# Patient Record
Sex: Male | Born: 1945 | Race: White | Hispanic: No | Marital: Married | Smoking: Former smoker
Health system: Southern US, Community
[De-identification: ages and names within clinical notes are randomized; demographics above are authoritative.]

## PROBLEM LIST (undated history)

## (undated) DIAGNOSIS — R7301 Impaired fasting glucose: Secondary | ICD-10-CM

## (undated) DIAGNOSIS — F419 Anxiety disorder, unspecified: Secondary | ICD-10-CM

## (undated) DIAGNOSIS — K219 Gastro-esophageal reflux disease without esophagitis: Secondary | ICD-10-CM

## (undated) DIAGNOSIS — F32A Depression, unspecified: Secondary | ICD-10-CM

## (undated) HISTORY — DX: Gastro-esophageal reflux disease without esophagitis: K21.9

## (undated) HISTORY — PX: HERNIA REPAIR: SHX51

## (undated) HISTORY — DX: Impaired fasting glucose: R73.01

## (undated) HISTORY — PX: OTHER SURGICAL HISTORY: SHX169

## (undated) HISTORY — PX: CERVICAL DISC SURGERY: SHX588

---

## 1998-03-23 ENCOUNTER — Inpatient Hospital Stay (HOSPITAL_COMMUNITY): Admission: RE | Admit: 1998-03-23 | Discharge: 1998-03-24 | Payer: Self-pay | Admitting: Neurosurgery

## 2004-01-05 ENCOUNTER — Emergency Department (HOSPITAL_COMMUNITY): Admission: EM | Admit: 2004-01-05 | Discharge: 2004-01-05 | Payer: Self-pay | Admitting: Emergency Medicine

## 2004-02-07 ENCOUNTER — Encounter (HOSPITAL_COMMUNITY): Admission: RE | Admit: 2004-02-07 | Discharge: 2004-03-08 | Payer: Self-pay | Admitting: Family Medicine

## 2005-01-03 ENCOUNTER — Ambulatory Visit (HOSPITAL_COMMUNITY): Admission: RE | Admit: 2005-01-03 | Discharge: 2005-01-03 | Payer: Self-pay | Admitting: General Surgery

## 2006-01-29 ENCOUNTER — Ambulatory Visit (HOSPITAL_COMMUNITY): Admission: RE | Admit: 2006-01-29 | Discharge: 2006-01-29 | Payer: Self-pay | Admitting: Family Medicine

## 2010-11-05 ENCOUNTER — Ambulatory Visit (INDEPENDENT_AMBULATORY_CARE_PROVIDER_SITE_OTHER): Payer: BC Managed Care – PPO | Admitting: Internal Medicine

## 2010-11-05 DIAGNOSIS — R195 Other fecal abnormalities: Secondary | ICD-10-CM

## 2010-11-17 NOTE — Consult Note (Signed)
NAME:  Jonathan Bryant, Jonathan Bryant               ACCOUNT NO.:  [id number]  MEDICAL RECORD NO.:  [id number]            PATIENT TYPE:  LOCATION:                                 FACILITY:  PHYSICIAN:  Lionel December, M.D.    DATE OF BIRTH:  02-17-1946  DATE OF CONSULTATION: DATE OF DISCHARGE:                                CONSULTATION   REASON FOR CONSULTATION:  Heme-positive stools.  HISTORY OF PRESENT ILLNESS:  Jonathan Bryant is a 65 year old white male referred to our office by Dr. Lubertha South.  In February, he was seen by Dr. Loran Senters and he returned 3 stool cards.  All three stool cards were positive.  He states that during that time he had been on doxycycline and Advil Cold and Sinus for head cold.  He was on the Advil Cold and Sinus medication for 4 days.  He returned to Dr. Gerda Diss on October 10, 2010, and was given 3 more stool cards and he returned and all 3 were negative.  He says since he had been on the antibiotic, his stomach "grins and giggles."  He describes a vague abdominal pain, which he said is really not a pain.  His appetite is good.  He has had no weight loss.  He has had no constipation or diarrhea.  He has a bowel movement daily.  They are brown in color.  He denies any black stools or rectal bleeding.  There has been no weight loss or anemia.  No nausea or vomiting.  His appetite is good.  His last colonoscopy was in May 2006 by Dr. Lovell Sheehan, which revealed multiple diverticula in the descending colon and the sigmoid colon.  The colonoscopy was otherwise normal. Repeat colonoscopy in 10 years.  There are no home medications.  He is not allergic to any medications.  SURGERIES:  He has had 2 low back surgeries, neck surgery.  He has had a right inguinal hernia repair.  Medical history is negative.  FAMILY HISTORY:  His mother is deceased from coronary artery disease. His father is deceased from COPD.  He has 4 sisters in good health.  He has two brothers, one has COPD  and one is in good health.  SOCIAL HISTORY:  He is married.  He is retired from [employer].  He does not smoke or do drugs.  He occasionally drinks a beer and he does not have any children.  OBJECTIVE:  VITAL SIGNS:  His weight is 151, his height is 5 feet 7 inches, his temperature is 97.2, his blood pressure is 112/72, and his pulse is 76. HEENT:  He has natural teeth in good condition.  His oral mucosa is moist.  There are no lesions.  His conjunctivae is pink.  His sclerae are anicteric. NECK:  His thyroid is normal.  There is no cervical lymphadenopathy. LUNGS:  Clear. ABDOMEN:  Soft.  Bowel sounds are positive.  No masses.  His stool was brown guaiac negative today. EXTREMITIES:  There is no edema to his extremities.  ASSESSMENT:  Jonathan Bryant is a 65 year old male presenting today with a history of 3 heme-positive stool  cards in February.  They were negative in March and his stool card was negative today.  He had been on Advil Cold and Sinus for 4 days when the stool cards were positive.  It is worrisome that he has had blood in his stool.  However, I think a colonic neoplasm, polyp, AVM, or ulcer needs to be ruled out at this time.  RECOMMENDATIONS:  We will schedule a colonoscopy with Dr. Karilyn Cota.  The risk and benefits were reviewed with the patient and he is agreeable.  I will also get a CBC on him today to be sure his hemoglobin is okay.    ______________________________ Dorene Ar, NP   ______________________________ Lionel December, M.D.    TS/MEDQ  D:  11/05/2010  T:  11/06/2010  Job:  [id number]  cc:   Donna Bernard, M.D. Fax: [phone]  Electronically Signed by Dorene Ar PA on 11/12/2010 08:36:32 AM Electronically Signed by Lionel December M.D. on 11/17/2010 01:47:31 PM

## 2010-12-26 ENCOUNTER — Encounter (HOSPITAL_BASED_OUTPATIENT_CLINIC_OR_DEPARTMENT_OTHER): Payer: BC Managed Care – PPO | Admitting: Internal Medicine

## 2010-12-26 ENCOUNTER — Ambulatory Visit (HOSPITAL_COMMUNITY)
Admission: RE | Admit: 2010-12-26 | Discharge: 2010-12-26 | Disposition: A | Discharge status: Home / Self Care | Payer: BC Managed Care – PPO | Source: Ambulatory Visit | Attending: Internal Medicine | Admitting: Internal Medicine

## 2010-12-26 DIAGNOSIS — K921 Melena: Secondary | ICD-10-CM | POA: Insufficient documentation

## 2010-12-26 DIAGNOSIS — K5730 Diverticulosis of large intestine without perforation or abscess without bleeding: Secondary | ICD-10-CM

## 2010-12-26 DIAGNOSIS — K644 Residual hemorrhoidal skin tags: Secondary | ICD-10-CM

## 2010-12-26 DIAGNOSIS — R195 Other fecal abnormalities: Secondary | ICD-10-CM

## 2011-01-12 NOTE — Op Note (Signed)
  NAME:  ENMANUEL, ZUFALL               ACCOUNT NO.:  [id number]  MEDICAL RECORD NO.:  [id number]           PATIENT TYPE:  O  LOCATION:  [facility]                          FACILITY:  [facility]  PHYSICIAN:  Lionel December, M.D.    DATE OF BIRTH:  10-15-1945  DATE OF PROCEDURE:  12/26/2010 DATE OF DISCHARGE:                              OPERATIVE REPORT   PROCEDURE:  Colonoscopy.  INDICATION:  Jonathan Bryant is a 65 year old Caucasian male who is noted to have heme-positive stool by his primary physician, Dr. Simone Curia and therefore undergoing diagnostic colonoscopy.  He does not have any GI symptoms.  His H and H was normal.  His last colonoscopy was approximately 6 years ago.  His family history is negative for colorectal carcinoma.  Procedure risks were reviewed with the patient and informed consent was obtained.  MEDICATIONS FOR CONSCIOUS SEDATION: 1. Demerol 25 mg IV. 2. Versed 6 mg IV.  FINDINGS:  Procedure performed in endoscopy suite.  The patient's vital signs and O2 sats were monitored during the procedure and remained stable.  The patient was placed in left lateral recumbent position and rectal examination performed.  No abnormality noted on external or digital exam.  Pentax videoscope was placed through rectum and advanced under vision into sigmoid colon and beyond.  Preparation was excellent. Moderate number of diverticula were noted at sigmoid colon and few more scattered and rest of the colon.  Scope was passed into cecum which was identified by appendiceal orifice and ileocecal valve.  Pictures were taken for the record.  Short segment of GI was also examined and was normal.  As the scope was withdrawn, colonic mucosa was carefully examined.  No polyps, tumor masses or other abnormalities were noted. Rectal mucosa was normal.  Scope was retroflexed to examine anorectal junction and small hemorrhoids noted below the dentate line.  Endoscope was straightened and withdrawn.   Withdrawal time was 10 minutes.  The patient tolerated the procedure well.  FINAL DIAGNOSES: 1. Normal terminal ileum. 2. Pancolonic diverticulosis.  Moderate number of diverticula at     sigmoid colon and few more scattered throughout the colon. 3. Small external hemorrhoids.  RECOMMENDATIONS: 1. High-fiber diet. 2. Consider next exam in 10 years from now.          ______________________________ Lionel December, M.D.     NR/MEDQ  D:  12/26/2010  T:  12/26/2010  Job:  [id number]  cc:   Donna Bernard, M.D. Fax: [phone]  Electronically Signed by Lionel December M.D. on 01/12/2011 01:09:05 PM

## 2013-01-20 ENCOUNTER — Encounter: Payer: Self-pay | Admitting: Nurse Practitioner

## 2013-01-20 ENCOUNTER — Ambulatory Visit (INDEPENDENT_AMBULATORY_CARE_PROVIDER_SITE_OTHER): Payer: Medicare Other | Admitting: Nurse Practitioner

## 2013-01-20 VITALS — BP 123/87 | Temp 98.2°F | Wt 159.0 lb

## 2013-01-20 DIAGNOSIS — J0100 Acute maxillary sinusitis, unspecified: Secondary | ICD-10-CM

## 2013-01-20 MED ORDER — AMOXICILLIN-POT CLAVULANATE 875-125 MG PO TABS: 1.0000 | ORAL_TABLET | Freq: Two times a day (BID) | ORAL | Status: DC

## 2013-01-20 MED ORDER — CEFTRIAXONE SODIUM 1 G IJ SOLR
1.0000 g | Freq: Once | INTRAMUSCULAR | Status: AC
  Administered 2013-01-20: 1 g via INTRAMUSCULAR

## 2013-01-21 ENCOUNTER — Encounter: Payer: Self-pay | Admitting: Nurse Practitioner

## 2013-01-21 NOTE — Progress Notes (Signed)
Subjective:  Presents with complaints of sinus symptoms over the past several days. Has progressively gone from clear to now yellow green mucus. Occasional cough. Runny nose. No fever. No wheezing. Facial area headache. Swelling and pressure near the right maxillary area. Sore throat. No ear pain.  Objective:   BP 123/87  Temp(Src) 98.2 F (36.8 C)  Wt 159 lb (72.122 kg) NAD. Alert, oriented. TMs clear effusion, no erythema. Pharynx injected with PND noted. Neck supple with mild soft adenopathy. Lungs clear. Heart regular rate rhythm.  Assessment:Maxillary sinusitis, acute - Plan: cefTRIAXone (ROCEPHIN) injection 1 g, amoxicillin-clavulanate (AUGMENTIN) 875-125 MG per tablet  Plan: Due to severity and intensity of symptoms, given Rocephin injection. Had some previous issues with steroids so will hold on these for now. Tomorrow start Augmentin as directed. Call back next week if no improvement, sooner if worse.

## 2013-08-15 ENCOUNTER — Encounter: Payer: Self-pay | Admitting: Family Medicine

## 2013-08-15 ENCOUNTER — Ambulatory Visit (INDEPENDENT_AMBULATORY_CARE_PROVIDER_SITE_OTHER): Payer: Medicare HMO | Admitting: Family Medicine

## 2013-08-15 VITALS — BP 120/80 | Temp 98.4°F | Ht 67.0 in | Wt 159.5 lb

## 2013-08-15 DIAGNOSIS — J322 Chronic ethmoidal sinusitis: Secondary | ICD-10-CM

## 2013-08-15 MED ORDER — BENZONATATE 100 MG PO CAPS: 100.0000 mg | ORAL_CAPSULE | Freq: Four times a day (QID) | ORAL | Status: DC | PRN

## 2013-08-15 MED ORDER — CEFTRIAXONE SODIUM 1 G IJ SOLR
500.0000 mg | Freq: Once | INTRAMUSCULAR | Status: AC
  Administered 2013-08-15: 500 mg via INTRAMUSCULAR

## 2013-08-15 MED ORDER — AMOXICILLIN-POT CLAVULANATE 875-125 MG PO TABS: 1.0000 | ORAL_TABLET | Freq: Two times a day (BID) | ORAL | Status: DC

## 2013-08-15 NOTE — Patient Instructions (Signed)
Use dimetapp liquid--three tspns spread out three to four times per day--will help drainage if you can tolerate

## 2013-08-15 NOTE — Progress Notes (Signed)
   Subjective:    Patient ID: Jonathan Bryant, male    DOB: Mar 13, 1946, 68 y.o.   MRN: [id number]  Sinusitis This is a new problem. The current episode started in the past 7 days. The problem has been gradually worsening since onset. There has been no fever. His pain is at a severity of 9/10. The pain is moderate. Associated symptoms include congestion, coughing, headaches, sinus pressure and a sore throat. Past treatments include oral decongestants. The treatment provided no relief.   yest the swallowing got bad with painful sore throat,  fels like ready to throw up No fever, started getting sick last mon or tue, started with achiness and sore throat Wife has bad cod Bad drainage,  Not sleeping, no bad headache,  diminshed enrgy  advil cold and sinus, tried nyquil  Review of Systems  HENT: Positive for congestion, sinus pressure and sore throat.   Respiratory: Positive for cough.   Neurological: Positive for headaches.       Objective:   Physical Exam Alert hydration good. Moderate malaise. HEENT frontal tenderness. Pharynx normal neck supple. Lungs clear heart regular rate and rhythm.       Assessment & Plan:  Impression 1 rhinosinusitis plan Augmentin twice a day 10 days. Symptomatic care discussed. Tessalon Perles. Cough. WSL

## 2013-08-22 ENCOUNTER — Ambulatory Visit (INDEPENDENT_AMBULATORY_CARE_PROVIDER_SITE_OTHER): Payer: Medicare HMO | Admitting: Family Medicine

## 2013-08-22 ENCOUNTER — Encounter: Payer: Self-pay | Admitting: Family Medicine

## 2013-08-22 VITALS — BP 130/78 | Temp 98.5°F | Ht 67.0 in | Wt 153.0 lb

## 2013-08-22 DIAGNOSIS — J322 Chronic ethmoidal sinusitis: Secondary | ICD-10-CM

## 2013-08-22 MED ORDER — ALPRAZOLAM 1 MG PO TABS: ORAL_TABLET | ORAL | Status: DC

## 2013-08-22 MED ORDER — LEVOFLOXACIN 500 MG PO TABS: 500.0000 mg | ORAL_TABLET | Freq: Every day | ORAL | Status: AC

## 2013-08-22 NOTE — Progress Notes (Signed)
   Subjective:    Patient ID: Jonathan Bryant, male    DOB: 20-Jan-1946, 68 y.o.   MRN: [id number]  Cough The current episode started 1 to 4 weeks ago. Associated symptoms include nasal congestion.   Still having sig cough and congestion, worrying the pt  Started feeling wore3 and diminished energy.  Minimal cough, ongoing drainage Patient admits to a lot of anxiety recently. Worse in recent weeks. Affect inability to sleep. Causing daytime fatigue. Requests a medicine also for this specifically.  Review of Systems  Respiratory: Positive for cough.    No vomiting no diarrhea no rash ROS otherwise negative    Objective:   Physical Exam  Alert mild malaise. HEENT moderate nasal congestion. Pharynx slight erythema neck supple. Lungs clear heart regular in rhythm.      Assessment & Plan:  Impression persistent rhinosinusitis with worsening insomnia/anxiety. Claims no depression. Claims no increased stress. Levaquin 500 daily 10 days. Xanax 1 mg one half to one each bedtime when necessary for sleep #30 symptomatic care discussed. WSL

## 2013-08-31 ENCOUNTER — Ambulatory Visit (INDEPENDENT_AMBULATORY_CARE_PROVIDER_SITE_OTHER): Payer: Medicare HMO | Admitting: Family Medicine

## 2013-08-31 ENCOUNTER — Encounter: Payer: Self-pay | Admitting: Family Medicine

## 2013-08-31 VITALS — BP 132/80 | Temp 98.1°F | Ht 67.0 in | Wt 150.0 lb

## 2013-08-31 DIAGNOSIS — R1310 Dysphagia, unspecified: Secondary | ICD-10-CM

## 2013-08-31 DIAGNOSIS — R490 Dysphonia: Secondary | ICD-10-CM

## 2013-08-31 MED ORDER — RANITIDINE HCL 300 MG PO TABS: 300.0000 mg | ORAL_TABLET | Freq: Every day | ORAL | Status: DC

## 2013-08-31 NOTE — Progress Notes (Signed)
   Subjective:    Patient ID: Jonathan Bryant, male    DOB: April 03, 1946, 68 y.o.   MRN: [id number]  HPIHaving trouble swallowing. Started over 1 month ago. Gets worse through out the day.   Gagging and strangling often  No sig sinus symptoms at this point  Talking not well, hoarse  Trouble sleeping and nervous about all of this  No major solid food blockage. Patient very concerned about what may be going on.  Claims no increased stress around Christmas.  Notes progressive hoarseness over the last several weeks. Worried that it could be something serious.  Has had some reflux symptoms off and on of late. Took a Zantac 150 with significant benefit.  No chest pain no back pain.  9 pound weight loss in the past month.  Notes insomnia with a prednisone and helping.  Day number: [phone]    Review of Systems No chest pain no headache no back pain no change in bowel habits no blood in stool ROS otherwise negative    Objective:   Physical Exam  Alert patient anxious appearing holding cup spitting very small amount of saliva in and at times during exam. Voice slightly hoarse. H&T slight nasal congestion TMs good. Appears normal neck supple no lymphadenopathy lungs clear. Heart regular in rhythm.      Assessment & Plan:  Impression somewhat curious presentation with a highly anxious appearing and stress appearing male he reports inability to swallow saliva and yet still able to swallow food. Also notes progressive hoarseness. Patient feels quite worried about all of this. Third visit in his many weeks for these concerns plan long discussion held. We'll press on with referral as to try to get to the bottom of all this. Attention to patient how sometimes anxiety can be a part of this, need to press on further workup however. WSL

## 2013-09-14 ENCOUNTER — Encounter (INDEPENDENT_AMBULATORY_CARE_PROVIDER_SITE_OTHER): Payer: Self-pay | Admitting: Internal Medicine

## 2013-09-14 ENCOUNTER — Ambulatory Visit (INDEPENDENT_AMBULATORY_CARE_PROVIDER_SITE_OTHER): Payer: Medicare HMO | Admitting: Internal Medicine

## 2013-09-14 VITALS — BP 112/68 | HR 72 | Temp 97.9°F | Ht 67.0 in | Wt 146.7 lb

## 2013-09-14 DIAGNOSIS — R1310 Dysphagia, unspecified: Secondary | ICD-10-CM | POA: Insufficient documentation

## 2013-09-14 NOTE — Progress Notes (Signed)
Subjective:     Patient ID: Jonathan Bryant, male   DOB: 1946-01-07, 68 y.o.   MRN: [id number]  HPI Presents today with c/o in December he started having sinus problems. He saw Dr. Wolfgang Phoenix and was given Rocephin  antibiotics and cough medication.  After a week, he tells me he continued to have drainage in his throat.  He saw Dr Wolfgang Phoenix a week later and he was started on another ? antibiotic. His symptoms have not resolved. He has lost about 16 pounds since December. His appetite is not good. He eats because he has to.  BMs are normal.  No melena or bright red rectal bleeding.  There is not esophageal pain. He did have pain before he was started on Zantac.   He tells me today that he cannot swallow his saliva.  He says if he has a good mouth full of food, he can swallow.  Patient has a spit cup with him.  He does have hoarseness today. He has a hacky cough in office.  Saw ENT in [place] (Dr. Benjamine Mola) yesterday. He underwent an laryngoscopy and was normal. No visible evidence of tumor or cancer.   12/26/2010 Colonoscopy Dr. Laural Golden: (Heme positive stools) FINAL DIAGNOSES:  1. Normal terminal ileum.  2. Pancolonic diverticulosis. Moderate number of diverticula at  sigmoid colon and few more scattered throughout the colon.  3. Small external hemorrhoids.    Review of Systems see hpi Current Outpatient Prescriptions  Medication Sig Dispense Refill  . ALPRAZolam (XANAX) 1 MG tablet One half to one at bedtime as needed anxiety or insomnia  30 tablet  0  . ranitidine (ZANTAC) 300 MG tablet Take 1 tablet (300 mg total) by mouth at bedtime.  30 tablet  1   No current facility-administered medications for this visit.   Past Medical History  Diagnosis Date  . GERD (gastroesophageal reflux disease)    Past Surgical History  Procedure Laterality Date  . Back surgery x 2      in the early 90s,. Neck surgery in 2000.  Marland Kitchen Rt inguinal surgery      late 90s   No Known Allergies     Social:  Married. No children. Retired from [employer].     Objective:   Physical Exam  Filed Vitals:   09/14/13 1012  BP: 112/68  Pulse: 72  Temp: 97.9 F (36.6 C)  Height: 5\' 7"  (1.702 m)  Weight: 146 lb 11.2 oz (66.543 kg)   Alert and oriented. Skin warm and dry. Oral mucosa is moist.   . Sclera anicteric, conjunctivae is pink. Thyroid not enlarged. No cervical lymphadenopathy. Lungs clear. Heart regular rate and rhythm.  Abdomen is soft. Bowel sounds are positive. No hepatomegaly. No abdominal masses felt. No tenderness.  No edema to lower extremities.       Assessment:     Dysphagia to liquids. Esophagitis needs to be ruled out. Weight loss. I discussed this case with Dr.Rehman.    Plan:    Barium Swallow. Further recommendations to follow.

## 2013-09-14 NOTE — Patient Instructions (Signed)
Barium swallow

## 2013-09-15 ENCOUNTER — Ambulatory Visit (HOSPITAL_COMMUNITY)
Admission: RE | Admit: 2013-09-15 | Discharge: 2013-09-15 | Disposition: A | Discharge status: Home / Self Care | Payer: Medicare HMO | Source: Ambulatory Visit | Attending: Internal Medicine | Admitting: Internal Medicine

## 2013-09-15 DIAGNOSIS — R1310 Dysphagia, unspecified: Secondary | ICD-10-CM | POA: Insufficient documentation

## 2013-09-19 ENCOUNTER — Telehealth (INDEPENDENT_AMBULATORY_CARE_PROVIDER_SITE_OTHER): Payer: Self-pay | Admitting: Internal Medicine

## 2013-09-19 ENCOUNTER — Telehealth (INDEPENDENT_AMBULATORY_CARE_PROVIDER_SITE_OTHER): Payer: Self-pay | Admitting: *Deleted

## 2013-09-19 DIAGNOSIS — R1310 Dysphagia, unspecified: Secondary | ICD-10-CM

## 2013-09-19 NOTE — Telephone Encounter (Signed)
Forwarded to Terri 

## 2013-09-19 NOTE — Telephone Encounter (Signed)
Jonathan Bryant said Jonathan Bryant had a procedure on 09/15/13 and is having a lot of trouble swallowing. Please return the call at [phone].

## 2013-09-19 NOTE — Telephone Encounter (Signed)
Patient came by office. No white patchy to oral pharynx.

## 2013-09-19 NOTE — Telephone Encounter (Signed)
I have spoken with patient and discussed with Dr. Laural Golden. Will have him do swallow test with speech pathologist.

## 2013-09-19 NOTE — Telephone Encounter (Signed)
Forwarded to Terri to address per her request.

## 2013-09-20 ENCOUNTER — Ambulatory Visit (INDEPENDENT_AMBULATORY_CARE_PROVIDER_SITE_OTHER): Payer: Medicare HMO | Admitting: Family Medicine

## 2013-09-20 ENCOUNTER — Encounter: Payer: Self-pay | Admitting: Family Medicine

## 2013-09-20 VITALS — BP 130/78 | Ht 67.0 in | Wt 145.8 lb

## 2013-09-20 DIAGNOSIS — F32A Depression, unspecified: Secondary | ICD-10-CM

## 2013-09-20 DIAGNOSIS — F3289 Other specified depressive episodes: Secondary | ICD-10-CM

## 2013-09-20 DIAGNOSIS — F411 Generalized anxiety disorder: Secondary | ICD-10-CM

## 2013-09-20 DIAGNOSIS — G4700 Insomnia, unspecified: Secondary | ICD-10-CM

## 2013-09-20 DIAGNOSIS — F329 Major depressive disorder, single episode, unspecified: Secondary | ICD-10-CM

## 2013-09-20 MED ORDER — ESCITALOPRAM OXALATE 10 MG PO TABS: 10.0000 mg | ORAL_TABLET | Freq: Every day | ORAL | Status: DC

## 2013-09-20 MED ORDER — ALPRAZOLAM 1 MG PO TABS
ORAL_TABLET | ORAL | Status: DC
Start: 2013-09-20 — End: 2013-10-11

## 2013-09-20 NOTE — Progress Notes (Signed)
   Subjective:    Patient ID: Charlyn Minerva, male    DOB: [birth year], 68 y.o.   MRN: [id number]  HPI Patient arrives to discuss problems with depression and anxiety.  Saw both dr Laural Golden and dr Melene Plan, described the various problems  Had a swallow test and it was negative  Teo's first eval showed no evidence of difficulty based on scope exam  Always been a worier,  shakey and anxious a lot  Not interested in eating or drinking  Not wanting to go anywhere  Not wanting to get out and see folks  Gets shaky and anxious at timesno suicidal thoughts   Feels fatigued at times. Diminished energy.  Trouble sleeping at nighttime.  Long-standing history of anxiety but it has never been this bad.  Cannot think of a particular cause for it.  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 Often spitting in a cup. Yet able T. food. Has lost some weight.  Review of Systems No chest pain no back pain no abdominal pain no frank vomiting no diarrhea no blood in stool no change in bowel habit    Objective:   Physical Exam  Alert somewhat disheveled. Somewhat depressed affect. Oriented x3. HEENT normal. Lungs clear. Heart regular in rhythm. Abdomen benign. Neuro intact.      Assessment & Plan:  Impression depression with element of anxiety discussed at length. 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 #4 insomnia likely related to 1 plan exercise strongly encourage. Diet discussed. Initiate Lexapro 10 daily. He presently him 1 mg at night half tablet during the day. Recheck as scheduled. Warning signs discussed. Call us if worsens. Easily 40 minutes spent most in discussion WSL

## 2013-09-25 DIAGNOSIS — F411 Generalized anxiety disorder: Secondary | ICD-10-CM | POA: Insufficient documentation

## 2013-09-25 DIAGNOSIS — G4700 Insomnia, unspecified: Secondary | ICD-10-CM | POA: Insufficient documentation

## 2013-09-25 DIAGNOSIS — F329 Major depressive disorder, single episode, unspecified: Secondary | ICD-10-CM | POA: Insufficient documentation

## 2013-09-25 DIAGNOSIS — F32A Depression, unspecified: Secondary | ICD-10-CM | POA: Insufficient documentation

## 2013-09-28 ENCOUNTER — Other Ambulatory Visit (INDEPENDENT_AMBULATORY_CARE_PROVIDER_SITE_OTHER): Payer: Self-pay | Admitting: Internal Medicine

## 2013-09-28 ENCOUNTER — Telehealth (HOSPITAL_COMMUNITY): Payer: Self-pay

## 2013-09-28 DIAGNOSIS — R1310 Dysphagia, unspecified: Secondary | ICD-10-CM

## 2013-10-03 ENCOUNTER — Ambulatory Visit: Payer: Medicare HMO | Admitting: Family Medicine

## 2013-10-04 ENCOUNTER — Encounter (HOSPITAL_COMMUNITY): Payer: Medicare HMO | Admitting: Speech Pathology

## 2013-10-04 ENCOUNTER — Other Ambulatory Visit (HOSPITAL_COMMUNITY): Payer: Medicare HMO

## 2013-10-11 ENCOUNTER — Encounter: Payer: Self-pay | Admitting: Family Medicine

## 2013-10-11 ENCOUNTER — Ambulatory Visit (INDEPENDENT_AMBULATORY_CARE_PROVIDER_SITE_OTHER): Payer: Medicare HMO | Admitting: Family Medicine

## 2013-10-11 ENCOUNTER — Ambulatory Visit: Payer: Medicare HMO | Admitting: Family Medicine

## 2013-10-11 VITALS — BP 122/80 | Ht 67.0 in | Wt 141.2 lb

## 2013-10-11 DIAGNOSIS — R5383 Other fatigue: Secondary | ICD-10-CM

## 2013-10-11 DIAGNOSIS — Z125 Encounter for screening for malignant neoplasm of prostate: Secondary | ICD-10-CM

## 2013-10-11 DIAGNOSIS — R5381 Other malaise: Secondary | ICD-10-CM

## 2013-10-11 DIAGNOSIS — Z0189 Encounter for other specified special examinations: Secondary | ICD-10-CM

## 2013-10-11 DIAGNOSIS — Z79899 Other long term (current) drug therapy: Secondary | ICD-10-CM

## 2013-10-11 MED ORDER — ALPRAZOLAM 1 MG PO TABS: ORAL_TABLET | ORAL | Status: DC

## 2013-10-11 MED ORDER — ESCITALOPRAM OXALATE 20 MG PO TABS: 20.0000 mg | ORAL_TABLET | Freq: Every day | ORAL | Status: DC

## 2013-10-11 NOTE — Progress Notes (Signed)
   Subjective:    Patient ID: Jonathan Bryant, male    DOB: 1946-05-28, 68 y.o.   MRN: [id number]  HPI Today's visit is a follow up visit for depression.   Pt notes better, but anxiety is still substantial  Took a half xanax.  Patient states that he still is experiencing nervousness, difficulty swallowing and loss of appetite. States he has no other concerns or issues noted at this time during this visit.   No personal history of thyroid disease. Still losing some weight. Occasional spells of shortness of breath and rapid heart rate.  Still spitting at times but improved.  Wife thinks patient needs more medication. Review of Systems No chest pain abdominal pain no back pain no change in bowel habits no blood in stool    Objective:   Physical Exam Alert hydration good. Lungs clear. Heart regular rate and rhythm. H&T normal. Thyroid nonpalpable.       Assessment & Plan:  Impression 1 weight loss #2 tachyarrhythmias likely related to #4. #3 dyspnea likely related to #4. #4 anxiety. #5 depression suboptimum discuss plan exercise encourage. Nutritional supplement discussed. Appropriate blood work. Increase Xanax. Increase Lexapro. Recheck in one month.

## 2013-10-12 LAB — BASIC METABOLIC PANEL
BUN: 19 mg/dL (ref 6–23)
CHLORIDE: 103 meq/L (ref 96–112)
CO2: 25 mEq/L (ref 19–32)
Calcium: 9.1 mg/dL (ref 8.4–10.5)
Creat: 0.81 mg/dL (ref 0.50–1.35)
GLUCOSE: 105 mg/dL — AB (ref 70–99)
POTASSIUM: 4 meq/L (ref 3.5–5.3)
SODIUM: 140 meq/L (ref 135–145)

## 2013-10-12 LAB — LIPID PANEL
CHOL/HDL RATIO: 3.8 ratio
Cholesterol: 167 mg/dL (ref 0–200)
HDL: 44 mg/dL (ref >39)
LDL Cholesterol: 102 mg/dL — ABNORMAL HIGH (ref 0–99)
Triglycerides: 105 mg/dL (ref <150)
VLDL: 21 mg/dL (ref 0–40)

## 2013-10-12 LAB — HEPATIC FUNCTION PANEL
ALK PHOS: 65 U/L (ref 39–117)
ALT: 15 U/L (ref 0–53)
AST: 16 U/L (ref 0–37)
Albumin: 4 g/dL (ref 3.5–5.2)
BILIRUBIN TOTAL: 1.2 mg/dL (ref 0.2–1.2)
Bilirubin, Direct: 0.2 mg/dL (ref 0.0–0.3)
Indirect Bilirubin: 1 mg/dL (ref 0.2–1.2)
Total Protein: 6.6 g/dL (ref 6.0–8.3)

## 2013-10-12 LAB — GLUCOSE, RANDOM: Glucose, Bld: 105 mg/dL — ABNORMAL HIGH (ref 70–99)

## 2013-10-12 LAB — TSH: TSH: 0.817 u[IU]/mL (ref 0.350–4.500)

## 2013-10-12 LAB — PSA: PSA: 0.88 ng/mL (ref <=4.00)

## 2013-10-16 ENCOUNTER — Encounter: Payer: Self-pay | Admitting: Family Medicine

## 2013-11-11 ENCOUNTER — Encounter: Payer: Self-pay | Admitting: Family Medicine

## 2013-11-11 ENCOUNTER — Ambulatory Visit (INDEPENDENT_AMBULATORY_CARE_PROVIDER_SITE_OTHER): Payer: Medicare HMO | Admitting: Family Medicine

## 2013-11-11 VITALS — BP 120/78 | Ht 67.0 in | Wt 142.1 lb

## 2013-11-11 DIAGNOSIS — F32A Depression, unspecified: Secondary | ICD-10-CM

## 2013-11-11 DIAGNOSIS — R1310 Dysphagia, unspecified: Secondary | ICD-10-CM

## 2013-11-11 DIAGNOSIS — F411 Generalized anxiety disorder: Secondary | ICD-10-CM

## 2013-11-11 DIAGNOSIS — F329 Major depressive disorder, single episode, unspecified: Secondary | ICD-10-CM

## 2013-11-11 DIAGNOSIS — F3289 Other specified depressive episodes: Secondary | ICD-10-CM

## 2013-11-11 DIAGNOSIS — G4700 Insomnia, unspecified: Secondary | ICD-10-CM

## 2013-11-11 MED ORDER — HYDROCORTISONE 2.5 % RE CREA: 1.0000 "application " | TOPICAL_CREAM | Freq: Two times a day (BID) | RECTAL | Status: DC

## 2013-11-11 MED ORDER — ESCITALOPRAM OXALATE 20 MG PO TABS: 20.0000 mg | ORAL_TABLET | Freq: Every day | ORAL | Status: DC

## 2013-11-11 MED ORDER — ALPRAZOLAM 1 MG PO TABS: ORAL_TABLET | ORAL | Status: DC

## 2013-11-11 NOTE — Progress Notes (Signed)
   Subjective:    Patient ID: Jonathan Bryant, male    DOB: 05/17/1946, 68 y.o.   MRN: [id number]  HPI Patient is here for a follow up visit on depression. Currently taking lexapro and xanax. Patient states that symptoms are slightly better. Patient states he still has some nervousness and some swallowing difficulties.    Patient had improved. Now somewhat worsened. Worried about this. Gave an example of a relative had difficulties with thyroid. Of note patient has had negative thyroid blood work.  Patient has seen ear nose and throat Dr. Had a full workup including negative laryngoscopy.  Is seen a gastroenterologist. They feel strong he does not have any serious gastroesophageal difficulties.  Exercising some but not a lot.  Had come off the Xanax but unable to do. Next  No suicidal or homicidal thoughts. Review of Systems    no headache no chest pain no back pain no abdominal pain no change have some blood in stool ROS otherwise negative Objective:   Physical Exam  Alert somewhat anxious. Lungs clear. Heart regular in rhythm. HEENT normal. Neuro intact. Neck supple no palpable masses thyroid normal.      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 Patient has had a fairly full workup. #3 weight gain finally has stopped patient actually gained a pound since last visit which is good and discuss plan maintain same medication. Diet exercise discussed. No further tests are now followup as scheduled. Warning signs discussed. WSL

## 2013-11-15 ENCOUNTER — Encounter: Payer: Self-pay | Admitting: *Deleted

## 2014-01-11 ENCOUNTER — Ambulatory Visit (INDEPENDENT_AMBULATORY_CARE_PROVIDER_SITE_OTHER): Payer: Medicare HMO | Admitting: Family Medicine

## 2014-01-11 ENCOUNTER — Encounter: Payer: Self-pay | Admitting: Family Medicine

## 2014-01-11 VITALS — BP 128/78 | Ht 67.0 in | Wt 137.0 lb

## 2014-01-11 DIAGNOSIS — F411 Generalized anxiety disorder: Secondary | ICD-10-CM

## 2014-01-11 DIAGNOSIS — F3289 Other specified depressive episodes: Secondary | ICD-10-CM

## 2014-01-11 DIAGNOSIS — F32A Depression, unspecified: Secondary | ICD-10-CM

## 2014-01-11 DIAGNOSIS — J329 Chronic sinusitis, unspecified: Secondary | ICD-10-CM

## 2014-01-11 DIAGNOSIS — G4700 Insomnia, unspecified: Secondary | ICD-10-CM

## 2014-01-11 DIAGNOSIS — F329 Major depressive disorder, single episode, unspecified: Secondary | ICD-10-CM

## 2014-01-11 DIAGNOSIS — R1310 Dysphagia, unspecified: Secondary | ICD-10-CM

## 2014-01-11 MED ORDER — ALPRAZOLAM 1 MG PO TABS: ORAL_TABLET | ORAL | Status: DC

## 2014-01-11 MED ORDER — AZITHROMYCIN 250 MG PO TABS: ORAL_TABLET | ORAL | Status: DC

## 2014-01-11 MED ORDER — ESCITALOPRAM OXALATE 20 MG PO TABS: 20.0000 mg | ORAL_TABLET | Freq: Every day | ORAL | Status: DC

## 2014-01-11 NOTE — Progress Notes (Signed)
   Subjective:    Patient ID: Jonathan Bryant, male    DOB: 1945/10/15, 68 y.o.   MRN: [id number]  HPI Patient is here today for a f/u visit from 4/3 for depression.  He said he is feeling a lot better. He is doing better with swallowing food. He is not quite at 100%, but it is improving.  He is sleeping better at night with the xanax.  Pt c/o sore throat he has had for the past 2 days. No congestion or drainage. No cough.   Feeling better, swallowing is almost back to normal  Much improvement  Has cut down the xanax some nightds  Half a couple times per day. Often not taking the late aft dosage  Mood has improved, has leveled out on the weight  Sore throat the last few days, little cong or drainage  Exercising regularly, has worked on it.   Review of Systems No chest pain no blood in stools no change in bowel habit no fever no chills no rash ROS otherwise negative    Objective:   Physical Exam  Pleasant no acute distress. Appears happier. Vital stable. Affect appropriate. Good blood pressure. HEENT nasal congestion. Slight pharynx erythematous neck supple. Lungs clear. Heart regular rate and rhythm. Abdomen benign. Neck normal.      Assessment & Plan:  Impression 1 insomnia improved. #2 depression and anxiety improved. #3 rhinosinusitis/pharyngitis discussed. #4 dysphagia improved plan diet exercise discussed in encourage. Maintain usual meds. Add Z-Pak. Symptomatic care discussed. Recheck as scheduled. WSL

## 2014-04-12 ENCOUNTER — Ambulatory Visit: Payer: Medicare HMO | Admitting: Family Medicine

## 2014-04-14 ENCOUNTER — Encounter: Payer: Self-pay | Admitting: Family Medicine

## 2014-04-14 ENCOUNTER — Ambulatory Visit (INDEPENDENT_AMBULATORY_CARE_PROVIDER_SITE_OTHER): Payer: Medicare HMO | Admitting: Family Medicine

## 2014-04-14 ENCOUNTER — Ambulatory Visit: Payer: Medicare HMO | Admitting: Family Medicine

## 2014-04-14 VITALS — BP 120/80 | Ht 67.0 in | Wt 136.1 lb

## 2014-04-14 DIAGNOSIS — F329 Major depressive disorder, single episode, unspecified: Secondary | ICD-10-CM

## 2014-04-14 DIAGNOSIS — G4700 Insomnia, unspecified: Secondary | ICD-10-CM

## 2014-04-14 DIAGNOSIS — F32A Depression, unspecified: Secondary | ICD-10-CM

## 2014-04-14 DIAGNOSIS — F411 Generalized anxiety disorder: Secondary | ICD-10-CM

## 2014-04-14 DIAGNOSIS — F3289 Other specified depressive episodes: Secondary | ICD-10-CM

## 2014-04-14 MED ORDER — ESCITALOPRAM OXALATE 20 MG PO TABS: 20.0000 mg | ORAL_TABLET | Freq: Every day | ORAL | Status: DC

## 2014-04-14 MED ORDER — ALPRAZOLAM 1 MG PO TABS: ORAL_TABLET | ORAL | Status: DC

## 2014-04-14 NOTE — Progress Notes (Signed)
   Subjective:    Patient ID: Jonathan Bryant, male    DOB: [birth year], 68 y.o.   MRN: [id number]  HPI Patient is here today for a follow up visit on depression. Patient currently takes Alprazolam PRN and Lexapro 20 mg daily. Patient states that the medication is not working like it was. He is becoming more nervous and having to take the Alprazolam a lot more often.   Pt feels shaky and not feeling as good as this sumemer  Compliant with lexapro every day  Going to beach in October.  Patient states that he has no other concerns at this time.   Goes fishing out in the fall,  Some family stresses, hx of skin ca lately,   Trying to space out the sanax but ends up having to take a dose,  Review of Systems No chest pain no headache no back pain no abdominal pain no change in bowel habits    Objective:   Physical Exam  Alert anxious appearing. Weight stable. Lungs clear. Heart regular in rhythm. H&T normal.      Assessment & Plan:  Impression worsening anxiety. Patient claims depression is stable. He has found when he takes an extra half dose of Xanax it helps. Plan Will incr xanax to two and a half per day, one half tid and one at night,

## 2014-05-01 ENCOUNTER — Encounter: Payer: Self-pay | Admitting: Family Medicine

## 2014-05-01 ENCOUNTER — Ambulatory Visit (INDEPENDENT_AMBULATORY_CARE_PROVIDER_SITE_OTHER): Payer: Medicare HMO | Admitting: Family Medicine

## 2014-05-01 VITALS — BP 130/82 | Ht 67.0 in | Wt 136.0 lb

## 2014-05-01 DIAGNOSIS — R21 Rash and other nonspecific skin eruption: Secondary | ICD-10-CM

## 2014-05-01 MED ORDER — TRIAMCINOLONE ACETONIDE 0.1 % EX CREA: 1.0000 "application " | TOPICAL_CREAM | Freq: Two times a day (BID) | CUTANEOUS | Status: DC

## 2014-05-01 MED ORDER — PREDNISONE 10 MG PO TABS: ORAL_TABLET | ORAL | Status: DC

## 2014-05-01 NOTE — Progress Notes (Signed)
   Subjective:    Patient ID: Jonathan Bryant, male    DOB: 1946-07-31, 68 y.o.   MRN: [id number]  Rash This is a new problem. The current episode started in the past 7 days. The affected locations include the left arm, right arm, right lower leg, right upper leg, left upper leg and left lower leg. The rash is characterized by itchiness.    Started mainly on the ankles and such  Uses [place] off  Sprays a lot  Started about a week ago  Itches terrible at night   Chiggers or something similar wonder bu now doesn't think so    Review of Systems  Skin: Positive for rash.   no headache no chest pain no cough no shortness of breath     Objective:   Physical Exam Alert no apparent distress. Lungs clear. Heart regular in rhythm. HEENT normal. Skin diffuse erythematous macular rash patchy arms lower legs.       Assessment & Plan:  Impression contact dermatitis discussed at length. Etiology unclear. Plan prednisone taper. Localized triamcinolone twice a day. May add Benadryl when necessary. WSL

## 2014-06-16 ENCOUNTER — Ambulatory Visit (INDEPENDENT_AMBULATORY_CARE_PROVIDER_SITE_OTHER): Payer: Medicare HMO | Admitting: *Deleted

## 2014-06-16 DIAGNOSIS — Z23 Encounter for immunization: Secondary | ICD-10-CM

## 2014-07-19 ENCOUNTER — Encounter: Payer: Self-pay | Admitting: Family Medicine

## 2014-07-19 ENCOUNTER — Ambulatory Visit (INDEPENDENT_AMBULATORY_CARE_PROVIDER_SITE_OTHER): Payer: Medicare HMO | Admitting: Family Medicine

## 2014-07-19 VITALS — BP 104/70 | Temp 98.7°F | Ht 67.0 in | Wt 138.1 lb

## 2014-07-19 DIAGNOSIS — J329 Chronic sinusitis, unspecified: Secondary | ICD-10-CM

## 2014-07-19 MED ORDER — AMOXICILLIN-POT CLAVULANATE 875-125 MG PO TABS: 1.0000 | ORAL_TABLET | Freq: Two times a day (BID) | ORAL | Status: DC

## 2014-07-19 NOTE — Progress Notes (Signed)
   Subjective:    Patient ID: Jonathan Bryant, male    DOB: [birth year], 68 y.o.   MRN: [id number]  Sinusitis This is a new problem. The current episode started in the past 7 days. The problem has been gradually worsening since onset. Maximum temperature: 99.4. The pain is moderate. Associated symptoms include congestion, coughing, headaches and sinus pressure. Pertinent negatives include no ear pain. (Fatigue) Past treatments include acetaminophen. The treatment provided no relief.   Patient states that he has no other concerns at this time.    Review of Systems  Constitutional: Negative for fever and activity change.  HENT: Positive for congestion, rhinorrhea and sinus pressure. Negative for ear pain.   Eyes: Negative for discharge.  Respiratory: Positive for cough. Negative for wheezing.   Cardiovascular: Negative for chest pain.  Neurological: Positive for headaches.   Started up about 7 days ago started to get below better then got worse moderate sinus tenderness    Objective:   Physical Exam  Constitutional: He appears well-developed.  HENT:  Head: Normocephalic.  Mouth/Throat: Oropharynx is clear and moist. No oropharyngeal exudate.  Neck: Normal range of motion.  Cardiovascular: Normal rate, regular rhythm and normal heart sounds.   No murmur heard. Pulmonary/Chest: Effort normal and breath sounds normal. He has no wheezes.  Lymphadenopathy:    He has no cervical adenopathy.  Neurological: He exhibits normal muscle tone.  Skin: Skin is warm and dry.  Nursing note and vitals reviewed.  No sign of respiratory distress no sign of pneumonia no x-rays or lab work indicated.       Assessment & Plan:  Sinusitis antibiotics prescribed warning signs discussed follow-up if problems

## 2014-07-24 ENCOUNTER — Ambulatory Visit: Payer: Medicare HMO | Admitting: Nurse Practitioner

## 2014-07-24 ENCOUNTER — Ambulatory Visit: Payer: Medicare HMO | Admitting: Family Medicine

## 2014-08-14 ENCOUNTER — Encounter: Payer: Self-pay | Admitting: Family Medicine

## 2014-08-14 ENCOUNTER — Ambulatory Visit (INDEPENDENT_AMBULATORY_CARE_PROVIDER_SITE_OTHER): Payer: Medicare HMO | Admitting: Family Medicine

## 2014-08-14 VITALS — BP 110/68 | Ht 67.0 in | Wt 136.8 lb

## 2014-08-14 DIAGNOSIS — G4700 Insomnia, unspecified: Secondary | ICD-10-CM | POA: Diagnosis not present

## 2014-08-14 MED ORDER — ALPRAZOLAM 1 MG PO TABS: ORAL_TABLET | ORAL | Status: DC

## 2014-08-14 MED ORDER — ESCITALOPRAM OXALATE 20 MG PO TABS: 20.0000 mg | ORAL_TABLET | Freq: Every day | ORAL | Status: DC

## 2014-08-14 NOTE — Progress Notes (Signed)
   Subjective:    Patient ID: Jonathan Bryant, male    DOB: [birth year], 69 y.o.   MRN: [id number]  HPI  Patient arrives for a follow up on anxiety and depression. Patient states he still gets shaky at times and has to take his meds but overall is doing good.  Doing sit up and push ups and exercising regularly  Still walking   Has gained a couple of pounds  Gets nervous during the day  Take soccas xanax during day and regularly at night  Generic lexapro    Pt trying to cut back xanax  Substantial trouble sleeping at night. Still benefits deftly from the Xanax. Appetite overall improved. Exercising regularly.  Review of Systems No headache no chest pain no back pain no abdominal pain no change in bowel habits    Objective:   Physical Exam  Alert no acute distress. H&T normal. Lungs clear. Heart regular in rhythm.    Assessment & Plan:  Impression significant insomnia #2 anxiety with element of depression improved #3 weight loss stabilized plan maintain all meds. Patient is back down to to Xanax as per day equivalent. Exercise encourage. Recheck in 4 months for complete wellness exam. WSL

## 2014-09-29 IMAGING — RF DG ESOPHAGUS
15 of 24 series · 15 of 24 positions shown · non-contrast
Comparison: None

FLUOROSCOPY TIME:  2 min 12 seconds

CLINICAL DATA: Cervical dysphagia for liquids for 6 weeks, history
of cervical spine fusion in 5444

EXAM:
ESOPHOGRAM/BARIUM SWALLOW
TECHNIQUE: Combined double contrast and single contrast examination performed
using effervescent crystals, thick barium liquid, and thin barium
liquid. Patient also swallowed a 12.5 mm diameter barium tablet.

[Series 1: run · 1 of 1 slices shown (1 of 15)]
[im 1/1]
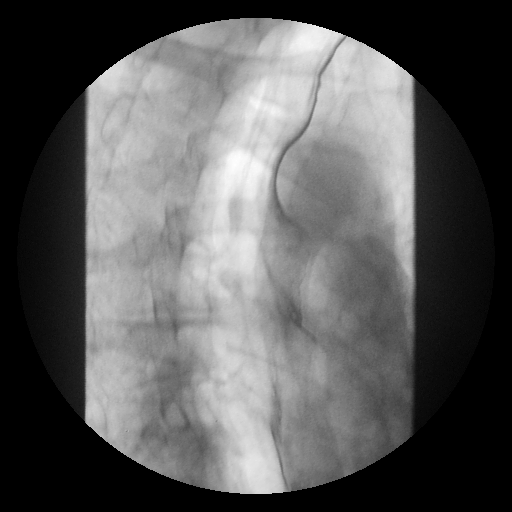

[Series 3: run · 1 of 1 slices shown (2 of 15)]
[im 1/1]
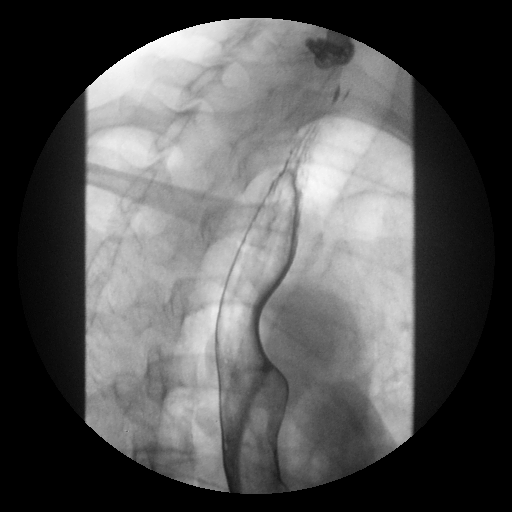

[Series 5: run · 1 of 1 slices shown (3 of 15)]
[im 1/1]
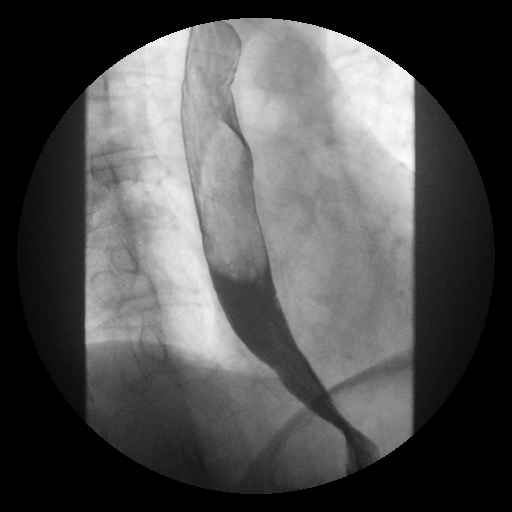

[Series 6: run · 1 of 1 slices shown (4 of 15)]
[im 1/1]
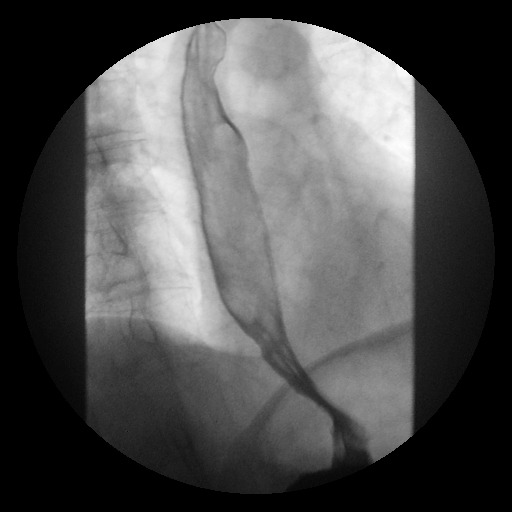

[Series 8: run · 1 of 1 slices shown (5 of 15)]
[im 1/1]
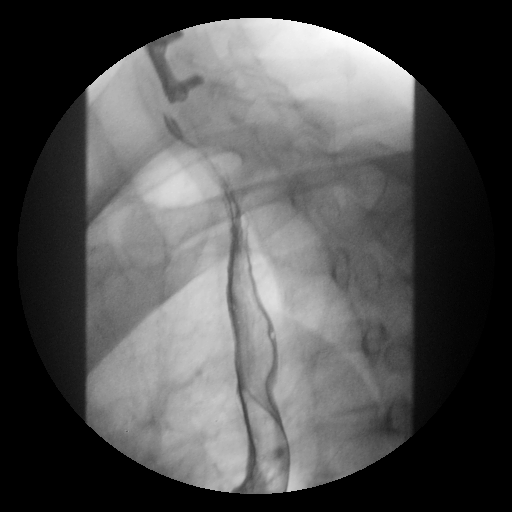

[Series 9: run · 1 of 1 slices shown (6 of 15)]
[im 1/1]
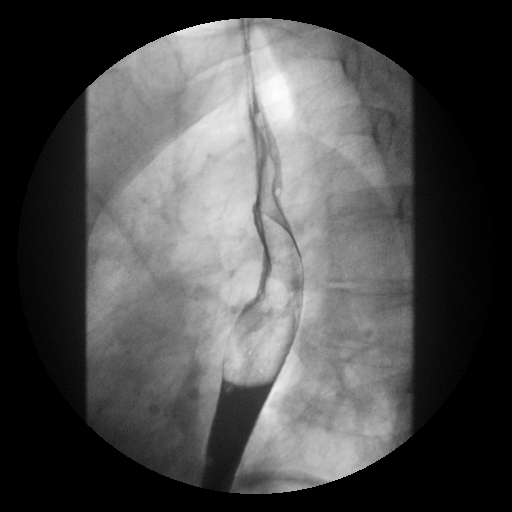

[Series 11: run · 1 of 1 slices shown (7 of 15)]
[im 1/1]
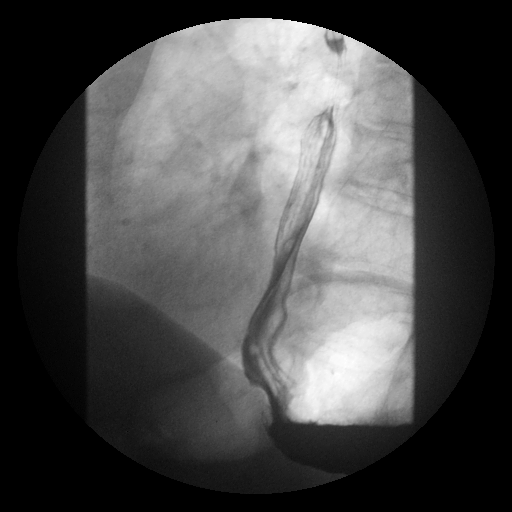

[Series 13: run · 1 of 1 slices shown (8 of 15)]
[im 1/1]
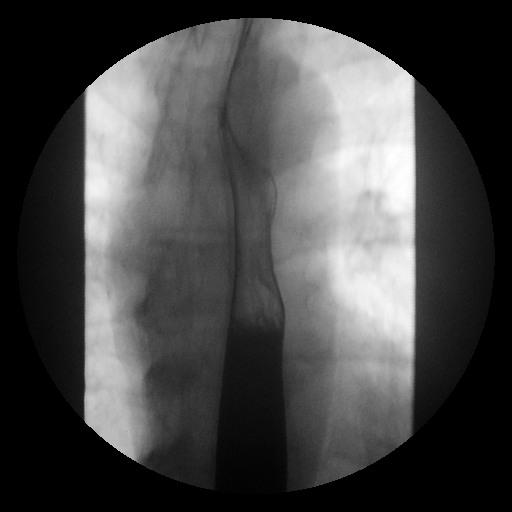

[Series 14: run · 1 of 1 slices shown (9 of 15)]
[im 1/1]
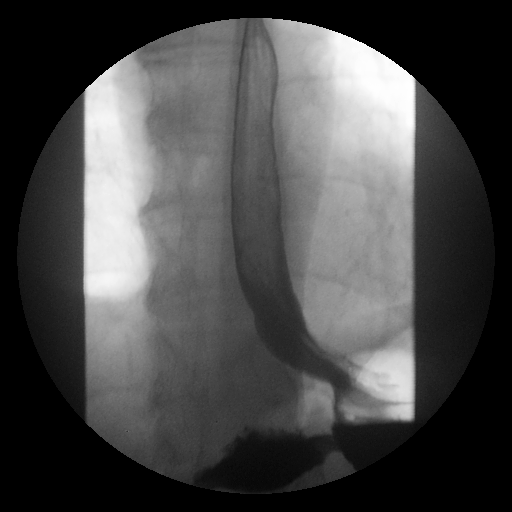

[Series 16: run · 1 of 1 slices shown (10 of 15)]
[im 1/1]
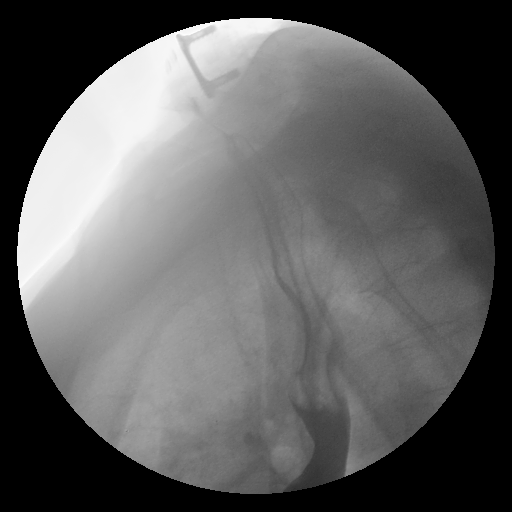

[Series 17: run · 1 of 1 slices shown (11 of 15)]
[im 1/1]
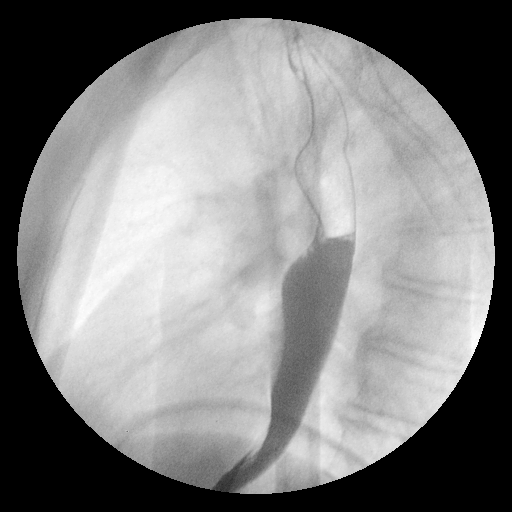

[Series 19: run · 1 of 1 slices shown (12 of 15)]
[im 1/1]
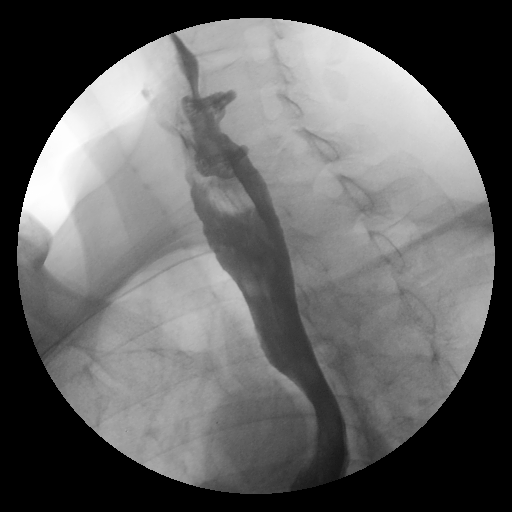

[Series 21: run · 1 of 1 slices shown (13 of 15)]
[im 1/1]
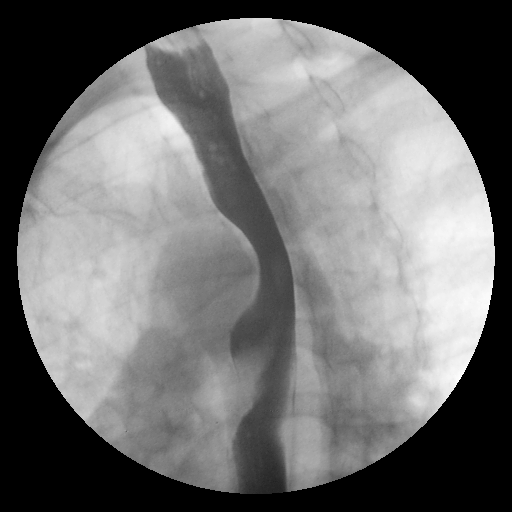

[Series 22: run · 1 of 1 slices shown (14 of 15)]
[im 1/1]
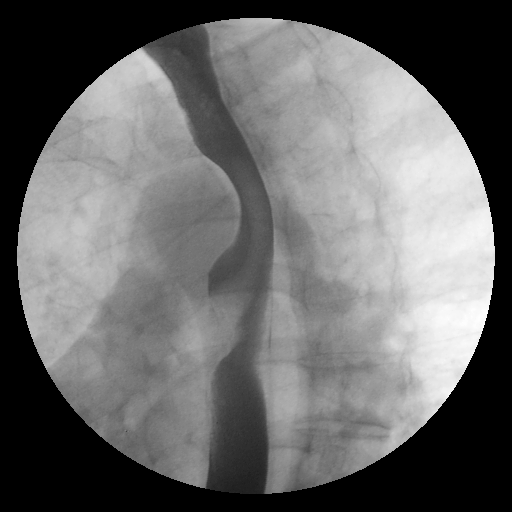

[Series 24: run · 1 of 1 slices shown (15 of 15)]
[im 1/1]
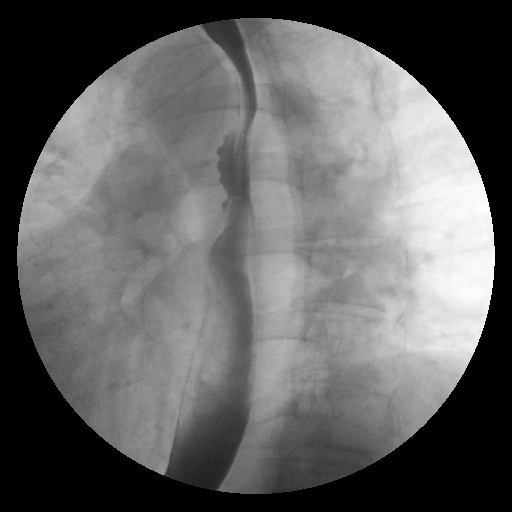

[15 of 24 positions shown; findings below may reference images not displayed]

FINDINGS: Normal esophageal distention and motility.

No esophageal mass or stricture.

12.5 mm diameter barium tablet passed from oral cavity to stomach
without delay.

Smooth appearance of esophageal mucosa on air contrast imaging
without irregularity or ulceration.

Prior cervical spine fusion noted anteriorly at C5-C6.

No hiatal hernia seen.

Targeted rapid sequence imaging of the cervical esophagus and
hypopharynx demonstrated normal motion without laryngeal penetration
or aspiration.

Very small piriform sinus residuals were seen bilaterally.

No persistent intraluminal filling defects.
IMPRESSION: No significant abnormalities identified.

Very small piriform sinus residuals.

## 2014-10-03 ENCOUNTER — Ambulatory Visit (INDEPENDENT_AMBULATORY_CARE_PROVIDER_SITE_OTHER): Payer: Medicare HMO | Admitting: Family Medicine

## 2014-10-03 ENCOUNTER — Encounter: Payer: Self-pay | Admitting: Family Medicine

## 2014-10-03 VITALS — BP 128/78 | Temp 97.7°F | Ht 67.0 in | Wt 137.4 lb

## 2014-10-03 DIAGNOSIS — J329 Chronic sinusitis, unspecified: Secondary | ICD-10-CM

## 2014-10-03 DIAGNOSIS — J209 Acute bronchitis, unspecified: Secondary | ICD-10-CM

## 2014-10-03 DIAGNOSIS — J310 Chronic rhinitis: Secondary | ICD-10-CM

## 2014-10-03 MED ORDER — AMOXICILLIN-POT CLAVULANATE 875-125 MG PO TABS
1.0000 | ORAL_TABLET | Freq: Two times a day (BID) | ORAL | Status: AC
Start: 2014-10-03 — End: 2014-10-13

## 2014-10-03 MED ORDER — BENZONATATE 100 MG PO CAPS: 100.0000 mg | ORAL_CAPSULE | Freq: Four times a day (QID) | ORAL | Status: DC | PRN

## 2014-10-03 MED ORDER — TRIAMCINOLONE ACETONIDE 0.1 % EX CREA: 1.0000 "application " | TOPICAL_CREAM | Freq: Two times a day (BID) | CUTANEOUS | Status: DC

## 2014-10-03 NOTE — Progress Notes (Signed)
   Subjective:    Patient ID: Charlyn Minerva, male    DOB: [birth year], 69 y.o.   MRN: [id number]  Cough This is a new problem. The current episode started in the past 7 days. Associated symptoms include nasal congestion. Associated symptoms comments: Sinus pressure.   Started as a cold with cong and drainage  lasteds for a week  Prod cough cong  Green yell phlegm  Pain in throat wotrse in the morn   Feels definitely in the chest feels congested Feels sore    Review of Systems  Respiratory: Positive for cough.    no vomiting no diarrhea no rash     Objective:   Physical Exam  alert mild malaise. HEENT slight nasal  Congestion. Lungs clear heart regular in rhythm. Intermittent bronchial cough during exam       Assessment & Plan:   impression rhinosinusitis/bronchitis plan antibiotics prescribed. Since Medicare discussed warning signs discussed. WSL

## 2014-11-16 ENCOUNTER — Telehealth: Payer: Self-pay | Admitting: Family Medicine

## 2014-11-16 DIAGNOSIS — Z125 Encounter for screening for malignant neoplasm of prostate: Secondary | ICD-10-CM

## 2014-11-16 DIAGNOSIS — Z13220 Encounter for screening for lipoid disorders: Secondary | ICD-10-CM

## 2014-11-16 DIAGNOSIS — Z79899 Other long term (current) drug therapy: Secondary | ICD-10-CM

## 2014-11-16 NOTE — Telephone Encounter (Signed)
Notified patient that blood work has been ordered. Report to [facility].

## 2014-11-16 NOTE — Telephone Encounter (Signed)
Lipid/liver/met 7/psa 

## 2014-11-16 NOTE — Telephone Encounter (Signed)
Pt needs bw orders for appt   Last labs 10/13/14 Lip, Hep Func, BMP, Glucose random, PSA, TSH

## 2014-11-16 NOTE — Telephone Encounter (Signed)
Last labs 10/12/13

## 2014-11-18 LAB — HEPATIC FUNCTION PANEL
ALT: 15 IU/L (ref 0–44)
AST: 18 IU/L (ref 0–40)
Albumin: 4.2 g/dL (ref 3.6–4.8)
Alkaline Phosphatase: 94 IU/L (ref 39–117)
Bilirubin Total: 0.9 mg/dL (ref 0.0–1.2)
Bilirubin, Direct: 0.19 mg/dL (ref 0.00–0.40)
Total Protein: 6.4 g/dL (ref 6.0–8.5)

## 2014-11-18 LAB — LIPID PANEL
Chol/HDL Ratio: 3.2 ratio units (ref 0.0–5.0)
Cholesterol, Total: 185 mg/dL (ref 100–199)
HDL: 58 mg/dL (ref >39)
LDL Calculated: 114 mg/dL — ABNORMAL HIGH (ref 0–99)
TRIGLYCERIDES: 64 mg/dL (ref 0–149)
VLDL Cholesterol Cal: 13 mg/dL (ref 5–40)

## 2014-11-18 LAB — BASIC METABOLIC PANEL
BUN / CREAT RATIO: 22 (ref 10–22)
BUN: 19 mg/dL (ref 8–27)
CO2: 23 mmol/L (ref 18–29)
Calcium: 8.7 mg/dL (ref 8.6–10.2)
Chloride: 103 mmol/L (ref 97–108)
Creatinine, Ser: 0.86 mg/dL (ref 0.76–1.27)
GFR calc non Af Amer: 90 mL/min/{1.73_m2} (ref >59)
GFR, EST AFRICAN AMERICAN: 104 mL/min/{1.73_m2} (ref >59)
Glucose: 104 mg/dL — ABNORMAL HIGH (ref 65–99)
POTASSIUM: 4.7 mmol/L (ref 3.5–5.2)
SODIUM: 140 mmol/L (ref 134–144)

## 2014-11-18 LAB — PSA: PSA: 0.9 ng/mL (ref 0.0–4.0)

## 2014-11-22 NOTE — Telephone Encounter (Signed)
Card sent 

## 2014-12-13 ENCOUNTER — Encounter: Payer: Self-pay | Admitting: Family Medicine

## 2014-12-13 ENCOUNTER — Ambulatory Visit (INDEPENDENT_AMBULATORY_CARE_PROVIDER_SITE_OTHER): Payer: Medicare HMO | Admitting: Family Medicine

## 2014-12-13 VITALS — BP 128/76 | Ht 65.25 in | Wt 133.0 lb

## 2014-12-13 DIAGNOSIS — Z0000 Encounter for general adult medical examination without abnormal findings: Secondary | ICD-10-CM

## 2014-12-13 MED ORDER — ALPRAZOLAM 1 MG PO TABS: ORAL_TABLET | ORAL | Status: DC

## 2014-12-13 MED ORDER — ESCITALOPRAM OXALATE 20 MG PO TABS: 20.0000 mg | ORAL_TABLET | Freq: Every day | ORAL | Status: DC

## 2014-12-13 NOTE — Progress Notes (Signed)
Subjective:    Patient ID: Jonathan Bryant, male    DOB: 09-25-1945, 69 y.o.   MRN: [id number]  HPI AWV- Annual Wellness Visit  The patient was seen for their annual wellness visit. The patient's past medical history, surgical history, and family history were reviewed. Pertinent vaccines were reviewed ( tetanus, pneumonia, shingles, flu) The patient's medication list was reviewed and updated.  The height and weight were entered. The patient's current BMI is: 21.96  Cognitive screening was completed. Outcome of Mini - Cog: pass  Falls within the past 6 months: none  Current tobacco usage: none (All patients who use tobacco were given written and verbal information on quitting)  Recent listing of emergency department/hospitalizations over the past year were reviewed.  current specialist the patient sees on a regular basis: none   Medicare annual wellness visit patient questionnaire was reviewed.  A written screening schedule for the patient for the next 5-10 years was given. Appropriate discussion of followup regarding next visit was discussed.  Cutting xanax down as much as possible  Exercise regulaly   +-              -++++++++++++Walking and staying active  Results for orders placed or performed in visit on 11/16/14  PSA  Result Value Ref Range   PSA 0.9 0.0 - 4.0 ng/mL  Lipid panel  Result Value Ref Range   Cholesterol, Total 185 100 - 199 mg/dL   Triglycerides 64 0 - 149 mg/dL   HDL 58 >39 mg/dL   VLDL Cholesterol Cal 13 5 - 40 mg/dL   LDL Calculated 114 (H) 0 - 99 mg/dL   Chol/HDL Ratio 3.2 0.0 - 5.0 ratio units  Hepatic function panel  Result Value Ref Range   Total Protein 6.4 6.0 - 8.5 g/dL   Albumin 4.2 3.6 - 4.8 g/dL   Bilirubin Total 0.9 0.0 - 1.2 mg/dL   Bilirubin, Direct 0.19 0.00 - 0.40 mg/dL   Alkaline Phosphatase 94 39 - 117 IU/L   AST 18 0 - 40 IU/L   ALT 15 0 - 44 IU/L  Basic metabolic panel  Result Value Ref Range   Glucose  104 (H) 65 - 99 mg/dL   BUN 19 8 - 27 mg/dL   Creatinine, Ser 0.86 0.76 - 1.27 mg/dL   GFR calc non Af Amer 90 >59 mL/min/1.73   GFR calc Af Amer 104 >59 mL/min/1.73   BUN/Creatinine Ratio 22 10 - 22   Sodium 140 134 - 144 mmol/L   Potassium 4.7 3.5 - 5.2 mmol/L   Chloride 103 97 - 108 mmol/L   CO2 23 18 - 29 mmol/L   Calcium 8.7 8.6 - 10.2 mg/dL    Got shingles vaccine covered also   Gets jittery at times, but overall feeling pretty good   Review of Systems  Constitutional: Negative for fever, activity change and appetite change.  HENT: Negative for congestion and rhinorrhea.   Eyes: Negative for discharge.  Respiratory: Negative for cough and wheezing.   Cardiovascular: Negative for chest pain.  Gastrointestinal: Negative for vomiting, abdominal pain and blood in stool.  Genitourinary: Negative for frequency and difficulty urinating.  Musculoskeletal: Negative for neck pain.  Skin: Negative for rash.  Allergic/Immunologic: Negative for environmental allergies and food allergies.  Neurological: Negative for weakness and headaches.  Psychiatric/Behavioral: Negative for agitation.  All other systems reviewed and are negative.      Objective:   Physical Exam  Constitutional: He appears well-developed and  well-nourished.  HENT:  Head: Normocephalic and atraumatic.  Right Ear: External ear normal.  Left Ear: External ear normal.  Nose: Nose normal.  Mouth/Throat: Oropharynx is clear and moist.  Eyes: EOM are normal. Pupils are equal, round, and reactive to light.  Neck: Normal range of motion. Neck supple. No thyromegaly present.  Cardiovascular: Normal rate, regular rhythm and normal heart sounds.   No murmur heard. Pulmonary/Chest: Effort normal and breath sounds normal. No respiratory distress. He has no wheezes.  Abdominal: Soft. Bowel sounds are normal. He exhibits no distension and no mass. There is no tenderness.  Genitourinary: Prostate normal and penis normal.    Musculoskeletal: Normal range of motion. He exhibits no edema.  Lymphadenopathy:    He has no cervical adenopathy.  Neurological: He is alert. He exhibits normal muscle tone.  Skin: Skin is warm and dry. No erythema.  Psychiatric: He has a normal mood and affect. His behavior is normal. Judgment normal.  Vitals reviewed.         Assessment & Plan:  Impression well adult exam #2 depression with anxiety clinically stable plan diet exercise discussed. Warning signs discussed medicines refilled. Blood work reviewed. WSL

## 2014-12-13 NOTE — Patient Instructions (Signed)
Results for orders placed or performed in visit on 11/16/14  PSA  Result Value Ref Range   PSA 0.9 0.0 - 4.0 ng/mL  Lipid panel  Result Value Ref Range   Cholesterol, Total 185 100 - 199 mg/dL   Triglycerides 64 0 - 149 mg/dL   HDL 58 >39 mg/dL   VLDL Cholesterol Cal 13 5 - 40 mg/dL   LDL Calculated 114 (H) 0 - 99 mg/dL   Chol/HDL Ratio 3.2 0.0 - 5.0 ratio units  Hepatic function panel  Result Value Ref Range   Total Protein 6.4 6.0 - 8.5 g/dL   Albumin 4.2 3.6 - 4.8 g/dL   Bilirubin Total 0.9 0.0 - 1.2 mg/dL   Bilirubin, Direct 0.19 0.00 - 0.40 mg/dL   Alkaline Phosphatase 94 39 - 117 IU/L   AST 18 0 - 40 IU/L   ALT 15 0 - 44 IU/L  Basic metabolic panel  Result Value Ref Range   Glucose 104 (H) 65 - 99 mg/dL   BUN 19 8 - 27 mg/dL   Creatinine, Ser 0.86 0.76 - 1.27 mg/dL   GFR calc non Af Amer 90 >59 mL/min/1.73   GFR calc Af Amer 104 >59 mL/min/1.73   BUN/Creatinine Ratio 22 10 - 22   Sodium 140 134 - 144 mmol/L   Potassium 4.7 3.5 - 5.2 mmol/L   Chloride 103 97 - 108 mmol/L   CO2 23 18 - 29 mmol/L   Calcium 8.7 8.6 - 10.2 mg/dL

## 2015-06-13 ENCOUNTER — Ambulatory Visit (INDEPENDENT_AMBULATORY_CARE_PROVIDER_SITE_OTHER): Payer: Medicare HMO | Admitting: Family Medicine

## 2015-06-13 ENCOUNTER — Encounter: Payer: Self-pay | Admitting: Family Medicine

## 2015-06-13 VITALS — BP 100/70 | Ht 65.25 in | Wt 139.0 lb

## 2015-06-13 DIAGNOSIS — F411 Generalized anxiety disorder: Secondary | ICD-10-CM

## 2015-06-13 DIAGNOSIS — F329 Major depressive disorder, single episode, unspecified: Secondary | ICD-10-CM

## 2015-06-13 DIAGNOSIS — F32A Depression, unspecified: Secondary | ICD-10-CM

## 2015-06-13 MED ORDER — ALPRAZOLAM 1 MG PO TABS: ORAL_TABLET | ORAL | Status: DC

## 2015-06-13 MED ORDER — ESCITALOPRAM OXALATE 20 MG PO TABS: 20.0000 mg | ORAL_TABLET | Freq: Every day | ORAL | Status: DC

## 2015-06-13 NOTE — Progress Notes (Signed)
   Subjective:    Patient ID: Jonathan Bryant, male    DOB: 1946/02/27, 69 y.o.   MRN: [id number]  HPI Patient is here today for a follow up visit on depression. Patient currently taking Lexapro 20 mg daily. Patient is doing well.   depr overal stable  May incr dose  On up to  Staying active and busy, has to stya busy   Needs his xanax slightly increased. Patient has no other concerns at this time. Patient feels he needs another half tablet in order to help him.  No suicidal or homicidal thoughts   Review of Systems No headache no chest pain no back pain no abdominal pain    Objective:   Physical Exam  Alert vitals stable no acute distress H&T normal lungs clear heart regular rate and rhythm      Assessment & Plan:  Impression 1 depression good control #2 anxiety suboptimum will increase medicines plan increase meds diet exercise discussed follow-up in 6 months WSL

## 2015-07-09 DIAGNOSIS — B9689 Other specified bacterial agents as the cause of diseases classified elsewhere: Secondary | ICD-10-CM | POA: Diagnosis not present

## 2015-07-09 DIAGNOSIS — L02229 Furuncle of trunk, unspecified: Secondary | ICD-10-CM | POA: Diagnosis not present

## 2015-07-23 DIAGNOSIS — B9689 Other specified bacterial agents as the cause of diseases classified elsewhere: Secondary | ICD-10-CM | POA: Diagnosis not present

## 2015-07-23 DIAGNOSIS — L02229 Furuncle of trunk, unspecified: Secondary | ICD-10-CM | POA: Diagnosis not present

## 2015-08-27 ENCOUNTER — Telehealth: Payer: Self-pay | Admitting: Family Medicine

## 2015-08-27 DIAGNOSIS — L02229 Furuncle of trunk, unspecified: Secondary | ICD-10-CM | POA: Diagnosis not present

## 2015-08-27 DIAGNOSIS — B9689 Other specified bacterial agents as the cause of diseases classified elsewhere: Secondary | ICD-10-CM | POA: Diagnosis not present

## 2015-08-27 MED ORDER — ESCITALOPRAM OXALATE 20 MG PO TABS: 20.0000 mg | ORAL_TABLET | Freq: Every day | ORAL | Status: DC

## 2015-08-27 NOTE — Telephone Encounter (Signed)
Pt is requesting a refill on escitalopram 20 mg tablets

## 2015-08-27 NOTE — Telephone Encounter (Signed)
Rx sent electronically to pharmacy. Patient notified. 

## 2015-08-27 NOTE — Addendum Note (Signed)
Addended by: Dairl Ponder on: 08/27/2015 03:33 PM   Modules accepted: Orders

## 2015-09-11 DIAGNOSIS — Z0100 Encounter for examination of eyes and vision without abnormal findings: Secondary | ICD-10-CM | POA: Diagnosis not present

## 2015-09-11 DIAGNOSIS — H5200 Hypermetropia, unspecified eye: Secondary | ICD-10-CM | POA: Diagnosis not present

## 2015-10-30 DIAGNOSIS — R69 Illness, unspecified: Secondary | ICD-10-CM | POA: Diagnosis not present

## 2015-11-19 ENCOUNTER — Telehealth: Payer: Self-pay | Admitting: Family Medicine

## 2015-11-19 DIAGNOSIS — Z125 Encounter for screening for malignant neoplasm of prostate: Secondary | ICD-10-CM

## 2015-11-19 DIAGNOSIS — E785 Hyperlipidemia, unspecified: Secondary | ICD-10-CM

## 2015-11-19 DIAGNOSIS — Z79899 Other long term (current) drug therapy: Secondary | ICD-10-CM

## 2015-11-19 MED ORDER — ESCITALOPRAM OXALATE 20 MG PO TABS: 20.0000 mg | ORAL_TABLET | Freq: Every day | ORAL | Status: DC

## 2015-11-19 NOTE — Telephone Encounter (Signed)
Pt is needing enough refills on his escitalopram (LEXAPRO) 20 MG tablet   to last him to his 5/8 appt.   Pt is also needing lab orders to be sent over. Last labs per epic were: psa,lipid,hepatic, and bmp on 11/17/2014.

## 2015-11-19 NOTE — Telephone Encounter (Signed)
Rep same bw thirty d ok

## 2015-11-19 NOTE — Addendum Note (Signed)
Addended by: Dairl Ponder on: 11/19/2015 11:49 AM   Modules accepted: Orders

## 2015-11-19 NOTE — Telephone Encounter (Signed)
Blood work ordered in [employer] and Rx sent electronically to pharmacy. Patient notified.

## 2015-12-03 DIAGNOSIS — E785 Hyperlipidemia, unspecified: Secondary | ICD-10-CM | POA: Diagnosis not present

## 2015-12-03 DIAGNOSIS — Z79899 Other long term (current) drug therapy: Secondary | ICD-10-CM | POA: Diagnosis not present

## 2015-12-03 DIAGNOSIS — Z125 Encounter for screening for malignant neoplasm of prostate: Secondary | ICD-10-CM | POA: Diagnosis not present

## 2015-12-04 LAB — BASIC METABOLIC PANEL
BUN / CREAT RATIO: 21 (ref 10–24)
BUN: 18 mg/dL (ref 8–27)
CHLORIDE: 102 mmol/L (ref 96–106)
CO2: 23 mmol/L (ref 18–29)
Calcium: 9 mg/dL (ref 8.6–10.2)
Creatinine, Ser: 0.85 mg/dL (ref 0.76–1.27)
GFR calc non Af Amer: 90 mL/min/{1.73_m2} (ref >59)
GFR, EST AFRICAN AMERICAN: 103 mL/min/{1.73_m2} (ref >59)
GLUCOSE: 105 mg/dL — AB (ref 65–99)
POTASSIUM: 4.8 mmol/L (ref 3.5–5.2)
SODIUM: 142 mmol/L (ref 134–144)

## 2015-12-04 LAB — LIPID PANEL
CHOL/HDL RATIO: 2.9 ratio (ref 0.0–5.0)
CHOLESTEROL TOTAL: 178 mg/dL (ref 100–199)
HDL: 61 mg/dL (ref >39)
LDL Calculated: 106 mg/dL — ABNORMAL HIGH (ref 0–99)
TRIGLYCERIDES: 57 mg/dL (ref 0–149)
VLDL Cholesterol Cal: 11 mg/dL (ref 5–40)

## 2015-12-04 LAB — HEPATIC FUNCTION PANEL
ALK PHOS: 83 IU/L (ref 39–117)
ALT: 21 IU/L (ref 0–44)
AST: 21 IU/L (ref 0–40)
Albumin: 4.2 g/dL (ref 3.6–4.8)
BILIRUBIN, DIRECT: 0.21 mg/dL (ref 0.00–0.40)
Bilirubin Total: 0.9 mg/dL (ref 0.0–1.2)
Total Protein: 6.6 g/dL (ref 6.0–8.5)

## 2015-12-04 LAB — PSA: Prostate Specific Ag, Serum: 1 ng/mL (ref 0.0–4.0)

## 2015-12-17 ENCOUNTER — Encounter: Payer: Self-pay | Admitting: Family Medicine

## 2015-12-17 ENCOUNTER — Ambulatory Visit (INDEPENDENT_AMBULATORY_CARE_PROVIDER_SITE_OTHER): Payer: Medicare HMO | Admitting: Family Medicine

## 2015-12-17 VITALS — BP 122/76 | Ht 65.0 in | Wt 136.0 lb

## 2015-12-17 DIAGNOSIS — E785 Hyperlipidemia, unspecified: Secondary | ICD-10-CM

## 2015-12-17 DIAGNOSIS — F329 Major depressive disorder, single episode, unspecified: Secondary | ICD-10-CM

## 2015-12-17 DIAGNOSIS — F32A Depression, unspecified: Secondary | ICD-10-CM

## 2015-12-17 DIAGNOSIS — Z0000 Encounter for general adult medical examination without abnormal findings: Secondary | ICD-10-CM | POA: Diagnosis not present

## 2015-12-17 MED ORDER — HYDROCORTISONE 2.5 % RE CREA: 1.0000 "application " | TOPICAL_CREAM | Freq: Two times a day (BID) | RECTAL | Status: DC

## 2015-12-17 MED ORDER — ALPRAZOLAM 1 MG PO TABS: ORAL_TABLET | ORAL | Status: DC

## 2015-12-17 MED ORDER — ESCITALOPRAM OXALATE 20 MG PO TABS: 20.0000 mg | ORAL_TABLET | Freq: Every day | ORAL | Status: DC

## 2015-12-17 NOTE — Patient Instructions (Signed)
Results for orders placed or performed in visit on 11/19/15  PSA  Result Value Ref Range   Prostate Specific Ag, Serum 1.0 0.0 - 4.0 ng/mL  Lipid panel  Result Value Ref Range   Cholesterol, Total 178 100 - 199 mg/dL   Triglycerides 57 0 - 149 mg/dL   HDL 61 >39 mg/dL   VLDL Cholesterol Cal 11 5 - 40 mg/dL   LDL Calculated 106 (H) 0 - 99 mg/dL   Chol/HDL Ratio 2.9 0.0 - 5.0 ratio units  Hepatic function panel  Result Value Ref Range   Total Protein 6.6 6.0 - 8.5 g/dL   Albumin 4.2 3.6 - 4.8 g/dL   Bilirubin Total 0.9 0.0 - 1.2 mg/dL   Bilirubin, Direct 0.21 0.00 - 0.40 mg/dL   Alkaline Phosphatase 83 39 - 117 IU/L   AST 21 0 - 40 IU/L   ALT 21 0 - 44 IU/L  Basic metabolic panel  Result Value Ref Range   Glucose 105 (H) 65 - 99 mg/dL   BUN 18 8 - 27 mg/dL   Creatinine, Ser 0.85 0.76 - 1.27 mg/dL   GFR calc non Af Amer 90 >59 mL/min/1.73   GFR calc Af Amer 103 >59 mL/min/1.73   BUN/Creatinine Ratio 21 10 - 24   Sodium 142 134 - 144 mmol/L   Potassium 4.8 3.5 - 5.2 mmol/L   Chloride 102 96 - 106 mmol/L   CO2 23 18 - 29 mmol/L   Calcium 9.0 8.6 - 10.2 mg/dL

## 2015-12-17 NOTE — Progress Notes (Signed)
Subjective:    Patient ID: Jonathan Bryant, male    DOB: 1946-01-27, 70 y.o.   MRN: [id number]  HPI AWV- Annual Wellness Visit  The patient was seen for their annual wellness visit. The patient's past medical history, surgical history, and family history were reviewed. Pertinent vaccines were reviewed ( tetanus, pneumonia, shingles, flu) The patient's medication list was reviewed and updated.  The height and weight were entered. The patient's current BMI is: 22.63   Cognitive screening was completed. Outcome of Mini - Cog: pass  Falls within the past 6 months: none  Current tobacco usage: none (All patients who use tobacco were given written and verbal information on quitting)  Recent listing of emergency department/hospitalizations over the past year were reviewed.  current specialist the patient sees on a regular basis: none   Medicare annual wellness visit patient questionnaire was reviewed.  A written screening schedule for the patient for the next 5-10 years was given. Appropriate discussion of followup regarding next visit was discussed.   Exercise staying active works hard, o thre move  Results for orders placed or performed in visit on 11/19/15  PSA  Result Value Ref Range   Prostate Specific Ag, Serum 1.0 0.0 - 4.0 ng/mL  Lipid panel  Result Value Ref Range   Cholesterol, Total 178 100 - 199 mg/dL   Triglycerides 57 0 - 149 mg/dL   HDL 61 >39 mg/dL   VLDL Cholesterol Cal 11 5 - 40 mg/dL   LDL Calculated 106 (H) 0 - 99 mg/dL   Chol/HDL Ratio 2.9 0.0 - 5.0 ratio units  Hepatic function panel  Result Value Ref Range   Total Protein 6.6 6.0 - 8.5 g/dL   Albumin 4.2 3.6 - 4.8 g/dL   Bilirubin Total 0.9 0.0 - 1.2 mg/dL   Bilirubin, Direct 0.21 0.00 - 0.40 mg/dL   Alkaline Phosphatase 83 39 - 117 IU/L   AST 21 0 - 40 IU/L   ALT 21 0 - 44 IU/L  Basic metabolic panel  Result Value Ref Range   Glucose 105 (H) 65 - 99 mg/dL   BUN 18 8 - 27 mg/dL   Creatinine,  Ser 0.85 0.76 - 1.27 mg/dL   GFR calc non Af Amer 90 >59 mL/min/1.73   GFR calc Af Amer 103 >59 mL/min/1.73   BUN/Creatinine Ratio 21 10 - 24   Sodium 142 134 - 144 mmol/L   Potassium 4.8 3.5 - 5.2 mmol/L   Chloride 102 96 - 106 mmol/L   CO2 23 18 - 29 mmol/L   Calcium 9.0 8.6 - 10.2 mg/dL   aniety still a prob, but much improve. Also notes depression overall is stable. Compliant with the Lexapro. States it definitely helps. No obvious side effects. Meds reviewed today.  Using xanax still and needs it,med helps,       Review of Systems  Constitutional: Negative for fever, activity change and appetite change.  HENT: Negative for congestion and rhinorrhea.   Eyes: Negative for discharge.  Respiratory: Negative for cough and wheezing.   Cardiovascular: Negative for chest pain.  Gastrointestinal: Negative for vomiting, abdominal pain and blood in stool.  Genitourinary: Negative for frequency and difficulty urinating.  Musculoskeletal: Negative for neck pain.  Skin: Negative for rash.  Allergic/Immunologic: Negative for environmental allergies and food allergies.  Neurological: Negative for weakness and headaches.  Psychiatric/Behavioral: Negative for agitation.  All other systems reviewed and are negative.      Objective:   Physical Exam  Constitutional: He appears well-developed and well-nourished.  HENT:  Head: Normocephalic and atraumatic.  Right Ear: External ear normal.  Left Ear: External ear normal.  Nose: Nose normal.  Mouth/Throat: Oropharynx is clear and moist.  Eyes: EOM are normal. Pupils are equal, round, and reactive to light.  Neck: Normal range of motion. Neck supple. No thyromegaly present.  Cardiovascular: Normal rate, regular rhythm and normal heart sounds.   No murmur heard. Pulmonary/Chest: Effort normal and breath sounds normal. No respiratory distress. He has no wheezes.  Abdominal: Soft. Bowel sounds are normal. He exhibits no distension and no  mass. There is no tenderness.  Genitourinary: Penis normal.  Musculoskeletal: Normal range of motion. He exhibits no edema.  Lymphadenopathy:    He has no cervical adenopathy.  Neurological: He is alert. He exhibits normal muscle tone.  Skin: Skin is warm and dry. No erythema.  Psychiatric: He has a normal mood and affect. His behavior is normal. Judgment normal.  Vitals reviewed.         Assessment & Plan:  Impression 1 wellness exam up-to-date on colonoscopy.diet exercise discussed. Vaccines discussed blood work results discussed #2 depression with element of anxiety clinically stable tolerating meds well to maintain same discussed 3 history of intermittent rectal irritation Anusol HC helps will refill plan all medications refilled. Diet exercise discussed recheck in 6 months. WSL

## 2016-04-28 DIAGNOSIS — Z23 Encounter for immunization: Secondary | ICD-10-CM | POA: Diagnosis not present

## 2016-05-14 DIAGNOSIS — R69 Illness, unspecified: Secondary | ICD-10-CM | POA: Diagnosis not present

## 2016-06-18 ENCOUNTER — Ambulatory Visit: Payer: Medicare HMO | Admitting: Family Medicine

## 2016-06-19 ENCOUNTER — Ambulatory Visit (INDEPENDENT_AMBULATORY_CARE_PROVIDER_SITE_OTHER): Payer: Medicare HMO | Admitting: Family Medicine

## 2016-06-19 ENCOUNTER — Encounter: Payer: Self-pay | Admitting: Family Medicine

## 2016-06-19 VITALS — BP 110/72 | Ht 65.0 in | Wt 140.4 lb

## 2016-06-19 DIAGNOSIS — F3341 Major depressive disorder, recurrent, in partial remission: Secondary | ICD-10-CM

## 2016-06-19 MED ORDER — ESCITALOPRAM OXALATE 20 MG PO TABS: 20.0000 mg | ORAL_TABLET | Freq: Every day | ORAL | 5 refills | Status: DC

## 2016-06-19 MED ORDER — ALPRAZOLAM 1 MG PO TABS: ORAL_TABLET | ORAL | 5 refills | Status: DC

## 2016-06-19 NOTE — Progress Notes (Signed)
   Subjective:    Patient ID: Jonathan Bryant, male    DOB: 11/28/1945, 70 y.o.   MRN: [id number]  HPI  Patient arrives for a follow up on depression. Patient states he seems to be doing well on his medication and has no problems or concerns.  Reg exercise frequently and staying bust  Exercising but doe sn not like it    Overall anxiety is better. Reports mood is better. Still definitely needs the Xanax at night to help him   Sleep at night is overall good, still takes the xanax and handles well   Review of Systems No headache, no major weight loss or weight gain, no chest pain no back pain abdominal pain no change in bowel habits complete ROS otherwise negative     Objective:   Physical Exam  Alert vitals stable, NAD. Blood pressure good on repeat. HEENT normal. Lungs clear. Heart regular rate and rhythm.       Assessment & Plan:  Impression anxiety and depression overall clinically stable plan maintain same meds diet exercise discussed recheck in 6 months for wellness plus chronic

## 2016-07-18 ENCOUNTER — Other Ambulatory Visit: Payer: Self-pay | Admitting: Family Medicine

## 2016-07-18 NOTE — Telephone Encounter (Signed)
May have may have this +1 refill

## 2016-09-22 ENCOUNTER — Telehealth: Payer: Self-pay | Admitting: Family Medicine

## 2016-09-22 DIAGNOSIS — Z79899 Other long term (current) drug therapy: Secondary | ICD-10-CM

## 2016-09-22 DIAGNOSIS — Z13220 Encounter for screening for lipoid disorders: Secondary | ICD-10-CM

## 2016-09-22 DIAGNOSIS — Z125 Encounter for screening for malignant neoplasm of prostate: Secondary | ICD-10-CM

## 2016-09-22 NOTE — Telephone Encounter (Signed)
Requesting order for blood work for appointment with DR. Richardson Landry on 12/17/16.

## 2016-09-22 NOTE — Telephone Encounter (Signed)
Rep same 

## 2016-09-22 NOTE — Telephone Encounter (Signed)
Blood work ordered in [facility]. Patient notified. 

## 2016-09-22 NOTE — Telephone Encounter (Signed)
Patient had Lipid, Liver, Met 7 and PSA 11/2015

## 2016-11-11 DIAGNOSIS — Z79899 Other long term (current) drug therapy: Secondary | ICD-10-CM | POA: Diagnosis not present

## 2016-11-11 DIAGNOSIS — Z13220 Encounter for screening for lipoid disorders: Secondary | ICD-10-CM | POA: Diagnosis not present

## 2016-11-11 DIAGNOSIS — Z125 Encounter for screening for malignant neoplasm of prostate: Secondary | ICD-10-CM | POA: Diagnosis not present

## 2016-11-12 LAB — HEPATIC FUNCTION PANEL
ALK PHOS: 82 IU/L (ref 39–117)
ALT: 16 IU/L (ref 0–44)
AST: 18 IU/L (ref 0–40)
Albumin: 4.2 g/dL (ref 3.6–4.8)
BILIRUBIN, DIRECT: 0.19 mg/dL (ref 0.00–0.40)
Bilirubin Total: 0.8 mg/dL (ref 0.0–1.2)
Total Protein: 6.5 g/dL (ref 6.0–8.5)

## 2016-11-12 LAB — BASIC METABOLIC PANEL
BUN / CREAT RATIO: 28 — AB (ref 10–24)
BUN: 24 mg/dL (ref 8–27)
CHLORIDE: 103 mmol/L (ref 96–106)
CO2: 23 mmol/L (ref 18–29)
Calcium: 8.7 mg/dL (ref 8.6–10.2)
Creatinine, Ser: 0.85 mg/dL (ref 0.76–1.27)
GFR, EST AFRICAN AMERICAN: 103 mL/min/{1.73_m2} (ref >59)
GFR, EST NON AFRICAN AMERICAN: 89 mL/min/{1.73_m2} (ref >59)
Glucose: 97 mg/dL (ref 65–99)
POTASSIUM: 4.7 mmol/L (ref 3.5–5.2)
Sodium: 142 mmol/L (ref 134–144)

## 2016-11-12 LAB — LIPID PANEL
CHOLESTEROL TOTAL: 190 mg/dL (ref 100–199)
Chol/HDL Ratio: 3.3 ratio (ref 0.0–5.0)
HDL: 58 mg/dL (ref >39)
LDL Calculated: 122 mg/dL — ABNORMAL HIGH (ref 0–99)
TRIGLYCERIDES: 51 mg/dL (ref 0–149)
VLDL Cholesterol Cal: 10 mg/dL (ref 5–40)

## 2016-11-12 LAB — PSA: PROSTATE SPECIFIC AG, SERUM: 0.9 ng/mL (ref 0.0–4.0)

## 2016-11-18 DIAGNOSIS — R69 Illness, unspecified: Secondary | ICD-10-CM | POA: Diagnosis not present

## 2016-12-17 ENCOUNTER — Ambulatory Visit (INDEPENDENT_AMBULATORY_CARE_PROVIDER_SITE_OTHER): Payer: Medicare HMO | Admitting: Family Medicine

## 2016-12-17 ENCOUNTER — Encounter: Payer: Self-pay | Admitting: Family Medicine

## 2016-12-17 VITALS — BP 136/74 | Ht 65.0 in | Wt 136.0 lb

## 2016-12-17 DIAGNOSIS — F321 Major depressive disorder, single episode, moderate: Secondary | ICD-10-CM

## 2016-12-17 DIAGNOSIS — N5201 Erectile dysfunction due to arterial insufficiency: Secondary | ICD-10-CM

## 2016-12-17 DIAGNOSIS — R69 Illness, unspecified: Secondary | ICD-10-CM | POA: Diagnosis not present

## 2016-12-17 DIAGNOSIS — Z0000 Encounter for general adult medical examination without abnormal findings: Secondary | ICD-10-CM | POA: Diagnosis not present

## 2016-12-17 MED ORDER — HYDROCORTISONE 2.5 % RE CREA: 1.0000 "application " | TOPICAL_CREAM | Freq: Two times a day (BID) | RECTAL | 5 refills | Status: DC

## 2016-12-17 MED ORDER — SILDENAFIL CITRATE 20 MG PO TABS: ORAL_TABLET | ORAL | 6 refills | Status: DC

## 2016-12-17 MED ORDER — ALPRAZOLAM 1 MG PO TABS: ORAL_TABLET | ORAL | 5 refills | Status: DC

## 2016-12-17 MED ORDER — ESCITALOPRAM OXALATE 20 MG PO TABS: 20.0000 mg | ORAL_TABLET | Freq: Every day | ORAL | 5 refills | Status: DC

## 2016-12-17 NOTE — Patient Instructions (Signed)
Results for orders placed or performed in visit on 09/22/16  Lipid panel  Result Value Ref Range   Cholesterol, Total 190 100 - 199 mg/dL   Triglycerides 51 0 - 149 mg/dL   HDL 58 >39 mg/dL   VLDL Cholesterol Cal 10 5 - 40 mg/dL   LDL Calculated 122 (H) 0 - 99 mg/dL   Chol/HDL Ratio 3.3 0.0 - 5.0 ratio  Hepatic function panel  Result Value Ref Range   Total Protein 6.5 6.0 - 8.5 g/dL   Albumin 4.2 3.6 - 4.8 g/dL   Bilirubin Total 0.8 0.0 - 1.2 mg/dL   Bilirubin, Direct 0.19 0.00 - 0.40 mg/dL   Alkaline Phosphatase 82 39 - 117 IU/L   AST 18 0 - 40 IU/L   ALT 16 0 - 44 IU/L  PSA  Result Value Ref Range   Prostate Specific Ag, Serum 0.9 0.0 - 4.0 ng/mL  Basic metabolic panel  Result Value Ref Range   Glucose 97 65 - 99 mg/dL   BUN 24 8 - 27 mg/dL   Creatinine, Ser 0.85 0.76 - 1.27 mg/dL   GFR calc non Af Amer 89 >59 mL/min/1.73   GFR calc Af Amer 103 >59 mL/min/1.73   BUN/Creatinine Ratio 28 (H) 10 - 24   Sodium 142 134 - 144 mmol/L   Potassium 4.7 3.5 - 5.2 mmol/L   Chloride 103 96 - 106 mmol/L   CO2 23 18 - 29 mmol/L   Calcium 8.7 8.6 - 10.2 mg/dL

## 2016-12-17 NOTE — Progress Notes (Signed)
Subjective:    Patient ID: Jonathan Bryant, male    DOB: 1945/09/01, 71 y.o.   MRN: [id number]  HPI  AWV- Annual Wellness Visit  The patient was seen for their annual wellness visit. The patient's past medical history, surgical history, and family history were reviewed. Pertinent vaccines were reviewed ( tetanus, pneumonia, shingles, flu) The patient's medication list was reviewed and updated.  The height and weight were entered. The patient's current BMI is:  Cognitive screening was completed. Outcome of Mini - Cog: Pass  Falls within the past 6 months: None   Current tobacco usage: None  (All patients who use tobacco were given written and verbal information on quitting)  Recent listing of emergency department/hospitalizations over the past year were reviewed.  current specialist the patient sees on a regular basis: No   Medicare annual wellness visit patient questionnaire was reviewed.  A written screening schedule for the patient for the next 5-10 years was given. Appropriate discussion of followup regarding next visit was discussed.   Results for orders placed or performed in visit on 09/22/16  Lipid panel  Result Value Ref Range   Cholesterol, Total 190 100 - 199 mg/dL   Triglycerides 51 0 - 149 mg/dL   HDL 58 >39 mg/dL   VLDL Cholesterol Cal 10 5 - 40 mg/dL   LDL Calculated 122 (H) 0 - 99 mg/dL   Chol/HDL Ratio 3.3 0.0 - 5.0 ratio  Hepatic function panel  Result Value Ref Range   Total Protein 6.5 6.0 - 8.5 g/dL   Albumin 4.2 3.6 - 4.8 g/dL   Bilirubin Total 0.8 0.0 - 1.2 mg/dL   Bilirubin, Direct 0.19 0.00 - 0.40 mg/dL   Alkaline Phosphatase 82 39 - 117 IU/L   AST 18 0 - 40 IU/L   ALT 16 0 - 44 IU/L  PSA  Result Value Ref Range   Prostate Specific Ag, Serum 0.9 0.0 - 4.0 ng/mL  Basic metabolic panel  Result Value Ref Range   Glucose 97 65 - 99 mg/dL   BUN 24 8 - 27 mg/dL   Creatinine, Ser 0.85 0.76 - 1.27 mg/dL   GFR calc non Af Amer 89 >59  mL/min/1.73   GFR calc Af Amer 103 >59 mL/min/1.73   BUN/Creatinine Ratio 28 (H) 10 - 24   Sodium 142 134 - 144 mmol/L   Potassium 4.7 3.5 - 5.2 mmol/L   Chloride 103 96 - 106 mmol/L   CO2 23 18 - 29 mmol/L   Calcium 8.7 8.6 - 10.2 mg/dL   Going to the gyn severl times per wk  Using elliptical prn  Overall watching diet eats well and healthy   Patient compliant with insomnia medication. Generally takes most nights. No obvious morning drowsiness. Definitely helps patient sleep. Without it patient states would not get a good nights rest.  Patient notes ongoing compliance with antidepressant medication. No obvious side effects. Reports does not miss a dose. Overall continues to help depression substantially. No thoughts of homicide or suicide. Would like to maintain medication.  Uses xanax equal to two tabd e day  hels   Review of Systems  Constitutional: Negative for activity change, appetite change and fever.  HENT: Negative for congestion and rhinorrhea.   Eyes: Negative for discharge.  Respiratory: Negative for cough and wheezing.   Cardiovascular: Negative for chest pain.  Gastrointestinal: Negative for abdominal pain, blood in stool and vomiting.  Genitourinary: Negative for difficulty urinating and frequency.  Musculoskeletal: Negative  for neck pain.  Skin: Negative for rash.  Allergic/Immunologic: Negative for environmental allergies and food allergies.  Neurological: Negative for weakness and headaches.  Psychiatric/Behavioral: Negative for agitation.  All other systems reviewed and are negative.  No headache, no major weight loss or weight gain, no chest pain no back pain abdominal pain no change in bowel habits complete ROS otherwise negative     Objective:   Physical Exam  Constitutional: He appears well-developed and well-nourished.  HENT:  Head: Normocephalic and atraumatic.  Right Ear: External ear normal.  Left Ear: External ear normal.  Nose: Nose normal.   Mouth/Throat: Oropharynx is clear and moist.  Eyes: EOM are normal. Pupils are equal, round, and reactive to light.  Neck: Normal range of motion. Neck supple. No thyromegaly present.  Cardiovascular: Normal rate, regular rhythm and normal heart sounds.   No murmur heard. Pulmonary/Chest: Effort normal and breath sounds normal. No respiratory distress. He has no wheezes.  Abdominal: Soft. Bowel sounds are normal. He exhibits no distension and no mass. There is no tenderness.  Genitourinary: Penis normal.  Genitourinary Comments: Prostate exam within normal limits  Musculoskeletal: Normal range of motion. He exhibits no edema.  Lymphadenopathy:    He has no cervical adenopathy.  Neurological: He is alert. He exhibits normal muscle tone.  Skin: Skin is warm and dry. No erythema.  Multiple discrete seborrheic keratoses  Psychiatric: He has a normal mood and affect. His behavior is normal. Judgment normal.  Vitals reviewed.         Assessment & Plan:  Impression 1 well adult exam. Diet discussed exercise discussed vaccines discussed up-to-date on colonoscopy. Blood work discussed #2 erectile dysfunction discussed patient would like to try medication medication discussed #3 depression with insomnia clinically stable maintain same meds meds refilled diet exercise discussed

## 2016-12-18 DIAGNOSIS — N529 Male erectile dysfunction, unspecified: Secondary | ICD-10-CM | POA: Insufficient documentation

## 2017-05-23 DIAGNOSIS — R69 Illness, unspecified: Secondary | ICD-10-CM | POA: Diagnosis not present

## 2017-05-25 ENCOUNTER — Encounter: Payer: Self-pay | Admitting: Family Medicine

## 2017-05-25 ENCOUNTER — Ambulatory Visit (INDEPENDENT_AMBULATORY_CARE_PROVIDER_SITE_OTHER): Payer: Medicare HMO | Admitting: Family Medicine

## 2017-05-25 VITALS — BP 120/80 | Ht 65.0 in | Wt 138.5 lb

## 2017-05-25 DIAGNOSIS — F5101 Primary insomnia: Secondary | ICD-10-CM | POA: Diagnosis not present

## 2017-05-25 DIAGNOSIS — K121 Other forms of stomatitis: Secondary | ICD-10-CM | POA: Diagnosis not present

## 2017-05-25 DIAGNOSIS — F411 Generalized anxiety disorder: Secondary | ICD-10-CM

## 2017-05-25 DIAGNOSIS — F3341 Major depressive disorder, recurrent, in partial remission: Secondary | ICD-10-CM | POA: Diagnosis not present

## 2017-05-25 DIAGNOSIS — N5201 Erectile dysfunction due to arterial insufficiency: Secondary | ICD-10-CM | POA: Diagnosis not present

## 2017-05-25 DIAGNOSIS — R69 Illness, unspecified: Secondary | ICD-10-CM | POA: Diagnosis not present

## 2017-05-25 MED ORDER — MAGIC MOUTHWASH: ORAL | 0 refills | Status: DC

## 2017-05-25 MED ORDER — ALPRAZOLAM 1 MG PO TABS: ORAL_TABLET | ORAL | 5 refills | Status: DC

## 2017-05-25 MED ORDER — ESCITALOPRAM OXALATE 20 MG PO TABS: 20.0000 mg | ORAL_TABLET | Freq: Every day | ORAL | 5 refills | Status: DC

## 2017-05-25 MED ORDER — SILDENAFIL CITRATE 20 MG PO TABS: ORAL_TABLET | ORAL | 6 refills | Status: DC

## 2017-05-25 NOTE — Progress Notes (Signed)
   Subjective:    Patient ID: Jonathan Bryant, male    DOB: April 28, 1946, 71 y.o.   MRN: [id number]  HPI Patient in today with complaints or pain to mouth. Patient states feels it is due to dry mouth. Has tried Biotene for mouth dryness.   States no other concerns this visit.  Five or so weeks ago,felt slimy and ore and excess mucus on mouth  All of sudden things started.  Using biotene,,helping a little     No prob with dry eeys  Goes to dentist  Every six mothth  Flu shot already got at the [facility]  o trouble swallowing or with appetite   Patient notes ongoing compliance with antidepressant medication. No obvious side effects. Reports does not miss a dose. Overall continues to help depression substantially. No thoughts of homicide or suicide. Would like to maintain medication.  Patient compliant with insomnia medication. Generally takes most nights. No obvious morning drowsiness. Definitely helps patient sleep. Without it patient states would not get a good nights rest.  Compliant with ADHD medication. No obvious side effects. Still helpful. Requests refills. Realizes appropriate use Review of Systems No headache, no major weight loss or weight gain, no chest pain no back pain abdominal pain no change in bowel habits complete ROS otherwise negative     Objective:   Physical Exam Alert and oriented, vitals reviewed and stable, NAD ENT-TM's and ext canals WNL bilat via otoscopic exam  Oral exam slightly dry mucous membranes otherwise within normal limits   Soft palate, tonsils and post pharynx WNL via oropharyngeal exam Neck-symmetric, no masses; thyroid nonpalpable and nontender Pulmonary-no tachypnea or accessory muscle use; Clear without wheezes via auscultation Card--no abnrml murmurs, rhythm reg and rate WNL Carotid pulses symmetric, without bruits        Assessment & Plan:  Impression 1 subacute stomatitis. Discussed. Not true burning mouth syndrome. The  patient certainly at risk.  #2 depression/anxiety clinically stable on meds compliance discussed  #3 insomnia clinically stable but definitely need for meds discussed  #4 erectile dysfunction ongoing need for meds  Patient on his had flu shot. Diet exercise discussed. Chronic medications refilled. Dukes Magic mouthwash appropriate use discussed.

## 2017-06-22 ENCOUNTER — Ambulatory Visit: Payer: Medicare HMO | Admitting: Family Medicine

## 2017-06-23 DIAGNOSIS — R69 Illness, unspecified: Secondary | ICD-10-CM | POA: Diagnosis not present

## 2017-11-09 ENCOUNTER — Telehealth: Payer: Self-pay | Admitting: Family Medicine

## 2017-11-09 NOTE — Telephone Encounter (Signed)
Robin from Pharmacy called to let us know that Dr.Stever DEA number expired yesterday. Dr.Scott spoke with pharmacist regarging patients Xanax.

## 2017-11-09 NOTE — Telephone Encounter (Signed)
Nurses-please connect with the patient.  Let him know that he needs to do lab work before his wellness visit in May.  Please order lipid, liver, metabolic 7, CBC, PSA (we did give him a one-time renewal on his Xanax it is possible he will need another renewal this will be per Dr. Richardson Landry)

## 2017-11-10 ENCOUNTER — Other Ambulatory Visit: Payer: Self-pay | Admitting: Family Medicine

## 2017-11-10 DIAGNOSIS — Z0000 Encounter for general adult medical examination without abnormal findings: Secondary | ICD-10-CM

## 2017-11-10 NOTE — Telephone Encounter (Signed)
Labs place in [place]; patient aware

## 2017-11-27 ENCOUNTER — Encounter: Payer: Medicare HMO | Admitting: Family Medicine

## 2017-11-27 DIAGNOSIS — Z0000 Encounter for general adult medical examination without abnormal findings: Secondary | ICD-10-CM | POA: Diagnosis not present

## 2017-11-28 LAB — CBC WITH DIFFERENTIAL/PLATELET
BASOS: 1 %
Basophils Absolute: 0 10*3/uL (ref 0.0–0.2)
EOS (ABSOLUTE): 0.4 10*3/uL (ref 0.0–0.4)
Eos: 5 %
Hematocrit: 44.1 % (ref 37.5–51.0)
Hemoglobin: 15.4 g/dL (ref 13.0–17.7)
IMMATURE GRANS (ABS): 0 10*3/uL (ref 0.0–0.1)
Immature Granulocytes: 0 %
Lymphocytes Absolute: 1.4 10*3/uL (ref 0.7–3.1)
Lymphs: 19 %
MCH: 30.1 pg (ref 26.6–33.0)
MCHC: 34.9 g/dL (ref 31.5–35.7)
MCV: 86 fL (ref 79–97)
Monocytes Absolute: 0.5 10*3/uL (ref 0.1–0.9)
Monocytes: 7 %
NEUTROS ABS: 5 10*3/uL (ref 1.4–7.0)
Neutrophils: 68 %
Platelets: 150 10*3/uL (ref 150–379)
RBC: 5.11 x10E6/uL (ref 4.14–5.80)
RDW: 13.5 % (ref 12.3–15.4)
WBC: 7.2 10*3/uL (ref 3.4–10.8)

## 2017-11-28 LAB — BASIC METABOLIC PANEL
BUN / CREAT RATIO: 29 — AB (ref 10–24)
BUN: 24 mg/dL (ref 8–27)
CO2: 24 mmol/L (ref 20–29)
CREATININE: 0.83 mg/dL (ref 0.76–1.27)
Calcium: 9.1 mg/dL (ref 8.6–10.2)
Chloride: 103 mmol/L (ref 96–106)
GFR calc Af Amer: 103 mL/min/{1.73_m2} (ref >59)
GFR calc non Af Amer: 89 mL/min/{1.73_m2} (ref >59)
GLUCOSE: 91 mg/dL (ref 65–99)
Potassium: 4.5 mmol/L (ref 3.5–5.2)
Sodium: 140 mmol/L (ref 134–144)

## 2017-11-28 LAB — HEPATIC FUNCTION PANEL
ALBUMIN: 4.3 g/dL (ref 3.5–4.8)
ALK PHOS: 86 IU/L (ref 39–117)
ALT: 15 IU/L (ref 0–44)
AST: 14 IU/L (ref 0–40)
BILIRUBIN, DIRECT: 0.19 mg/dL (ref 0.00–0.40)
Bilirubin Total: 0.9 mg/dL (ref 0.0–1.2)
TOTAL PROTEIN: 6.6 g/dL (ref 6.0–8.5)

## 2017-11-28 LAB — PSA: Prostate Specific Ag, Serum: 1.2 ng/mL (ref 0.0–4.0)

## 2017-11-28 LAB — LIPID PANEL
CHOL/HDL RATIO: 3.5 ratio (ref 0.0–5.0)
CHOLESTEROL TOTAL: 188 mg/dL (ref 100–199)
HDL: 54 mg/dL (ref >39)
LDL CALC: 124 mg/dL — AB (ref 0–99)
TRIGLYCERIDES: 48 mg/dL (ref 0–149)
VLDL Cholesterol Cal: 10 mg/dL (ref 5–40)

## 2017-12-21 ENCOUNTER — Other Ambulatory Visit: Payer: Self-pay | Admitting: *Deleted

## 2017-12-21 ENCOUNTER — Telehealth: Payer: Self-pay | Admitting: Family Medicine

## 2017-12-21 MED ORDER — ESCITALOPRAM OXALATE 20 MG PO TABS: 20.0000 mg | ORAL_TABLET | Freq: Every day | ORAL | 0 refills | Status: DC

## 2017-12-21 NOTE — Telephone Encounter (Signed)
Last seen oct 2018 for depression

## 2017-12-21 NOTE — Telephone Encounter (Signed)
Ok

## 2017-12-21 NOTE — Telephone Encounter (Signed)
Patient is requesting a refill on escitalopram (LEXAPRO) 20 MG tablet.  He will be out of this tomorrow.  He has an appointment on 12/23/17 with Dr. Richardson Landry.  [facility]

## 2017-12-21 NOTE — Telephone Encounter (Signed)
Med sent to pharm. Pt notified.  

## 2017-12-23 ENCOUNTER — Encounter: Payer: Self-pay | Admitting: Family Medicine

## 2017-12-23 ENCOUNTER — Ambulatory Visit (INDEPENDENT_AMBULATORY_CARE_PROVIDER_SITE_OTHER): Payer: Medicare HMO | Admitting: Family Medicine

## 2017-12-23 VITALS — BP 130/86 | Ht 65.0 in | Wt 137.8 lb

## 2017-12-23 DIAGNOSIS — F321 Major depressive disorder, single episode, moderate: Secondary | ICD-10-CM | POA: Diagnosis not present

## 2017-12-23 DIAGNOSIS — F3341 Major depressive disorder, recurrent, in partial remission: Secondary | ICD-10-CM | POA: Diagnosis not present

## 2017-12-23 DIAGNOSIS — Z0000 Encounter for general adult medical examination without abnormal findings: Secondary | ICD-10-CM | POA: Diagnosis not present

## 2017-12-23 DIAGNOSIS — R69 Illness, unspecified: Secondary | ICD-10-CM | POA: Diagnosis not present

## 2017-12-23 DIAGNOSIS — Z23 Encounter for immunization: Secondary | ICD-10-CM | POA: Diagnosis not present

## 2017-12-23 MED ORDER — ESCITALOPRAM OXALATE 20 MG PO TABS: 20.0000 mg | ORAL_TABLET | Freq: Every day | ORAL | 5 refills | Status: DC

## 2017-12-23 MED ORDER — ALPRAZOLAM 1 MG PO TABS: ORAL_TABLET | ORAL | 5 refills | Status: DC

## 2017-12-23 NOTE — Progress Notes (Signed)
Subjective:    Patient ID: Jonathan Bryant, male    DOB: [birth year], 72 y.o.   MRN: [id number]  HPI AWV- Annual Wellness Visit  The patient was seen for their annual wellness visit. The patient's past medical history, surgical history, and family history were reviewed. Pertinent vaccines were reviewed ( tetanus, pneumonia, shingles, flu) The patient's medication list was reviewed and updated.  The height and weight were entered.  BMI recorded in electronic record elsewhere  Cognitive screening was completed. Outcome of Mini - Cog: pass   Falls /depression screening electronically recorded within record elsewhere  Current tobacco usage:none (All patients who use tobacco were given written and verbal information on quitting)  Recent listing of emergency department/hospitalizations over the past year were reviewed.  current specialist the patient sees on a regular basis: none   Medicare annual wellness visit patient questionnaire was reviewed.  A written screening schedule for the patient for the next 5-10 years was given. Appropriate discussion of followup regarding next visit was discussed.   Patient notes ongoing compliance with antidepressant medication. No obvious side effects. Reports does not miss a dose. Overall continues to help depression substantially. No thoughts of homicide or suicide. Would like to maintain medication.  Results for orders placed or performed in visit on 11/10/17  PSA  Result Value Ref Range   Prostate Specific Ag, Serum 1.2 0.0 - 4.0 ng/mL  CBC with Differential  Result Value Ref Range   WBC 7.2 3.4 - 10.8 x10E3/uL   RBC 5.11 4.14 - 5.80 x10E6/uL   Hemoglobin 15.4 13.0 - 17.7 g/dL   Hematocrit 44.1 37.5 - 51.0 %   MCV 86 79 - 97 fL   MCH 30.1 26.6 - 33.0 pg   MCHC 34.9 31.5 - 35.7 g/dL   RDW 13.5 12.3 - 15.4 %   Platelets 150 150 - 379 x10E3/uL   Neutrophils 68 Not Estab. %   Lymphs 19 Not Estab. %   Monocytes 7 Not Estab. %   Eos 5  Not Estab. %   Basos 1 Not Estab. %   Neutrophils Absolute 5.0 1.4 - 7.0 x10E3/uL   Lymphocytes Absolute 1.4 0.7 - 3.1 x10E3/uL   Monocytes Absolute 0.5 0.1 - 0.9 x10E3/uL   EOS (ABSOLUTE) 0.4 0.0 - 0.4 x10E3/uL   Basophils Absolute 0.0 0.0 - 0.2 x10E3/uL   Immature Granulocytes 0 Not Estab. %   Immature Grans (Abs) 0.0 0.0 - 0.1 W41L2/GM  Basic Metabolic Panel (BMET)  Result Value Ref Range   Glucose 91 65 - 99 mg/dL   BUN 24 8 - 27 mg/dL   Creatinine, Ser 0.83 0.76 - 1.27 mg/dL   GFR calc non Af Amer 89 >59 mL/min/1.73   GFR calc Af Amer 103 >59 mL/min/1.73   BUN/Creatinine Ratio 29 (H) 10 - 24   Sodium 140 134 - 144 mmol/L   Potassium 4.5 3.5 - 5.2 mmol/L   Chloride 103 96 - 106 mmol/L   CO2 24 20 - 29 mmol/L   Calcium 9.1 8.6 - 10.2 mg/dL  Hepatic function panel  Result Value Ref Range   Total Protein 6.6 6.0 - 8.5 g/dL   Albumin 4.3 3.5 - 4.8 g/dL   Bilirubin Total 0.9 0.0 - 1.2 mg/dL   Bilirubin, Direct 0.19 0.00 - 0.40 mg/dL   Alkaline Phosphatase 86 39 - 117 IU/L   AST 14 0 - 40 IU/L   ALT 15 0 - 44 IU/L  Lipid Profile  Result Value Ref  Range   Cholesterol, Total 188 100 - 199 mg/dL   Triglycerides 48 0 - 149 mg/dL   HDL 54 >39 mg/dL   VLDL Cholesterol Cal 10 5 - 40 mg/dL   LDL Calculated 124 (H) 0 - 99 mg/dL   Chol/HDL Ratio 3.5 0.0 - 5.0 ratio   Exercising well, doing good with it  Diet overall pretty good   More jittery and nxious   Uses xanax plus lexapo sleep overall is good, tho not awesome     Review of Systems  Constitutional: Negative for activity change, appetite change and fever.  HENT: Negative for congestion and rhinorrhea.   Eyes: Negative for discharge.  Respiratory: Negative for cough and wheezing.   Cardiovascular: Negative for chest pain.  Gastrointestinal: Negative for abdominal pain, blood in stool and vomiting.  Genitourinary: Negative for difficulty urinating and frequency.  Musculoskeletal: Negative for neck pain.  Skin:  Negative for rash.  Allergic/Immunologic: Negative for environmental allergies and food allergies.  Neurological: Negative for weakness and headaches.  Psychiatric/Behavioral: Negative for agitation.  All other systems reviewed and are negative.      Objective:   Physical Exam  Constitutional: He appears well-developed and well-nourished.  HENT:  Head: Normocephalic and atraumatic.  Right Ear: External ear normal.  Left Ear: External ear normal.  Nose: Nose normal.  Mouth/Throat: Oropharynx is clear and moist.  Eyes: Pupils are equal, round, and reactive to light. EOM are normal.  Neck: Normal range of motion. Neck supple. No thyromegaly present.  Cardiovascular: Normal rate, regular rhythm and normal heart sounds.  No murmur heard. Pulmonary/Chest: Effort normal and breath sounds normal. No respiratory distress. He has no wheezes.  Abdominal: Soft. Bowel sounds are normal. He exhibits no distension and no mass. There is no tenderness.  Genitourinary: Prostate normal and penis normal.  Musculoskeletal: Normal range of motion. He exhibits no edema.  Lymphadenopathy:    He has no cervical adenopathy.  Neurological: He is alert. He exhibits normal muscle tone.  Skin: Skin is warm and dry. No erythema.  Substantial keratoses upper back  Psychiatric: He has a normal mood and affect. His behavior is normal. Judgment normal.          Assessment & Plan:  Impression wellness exam.  Diet discussed.  Exercise discussed.  Up-to-date on colonoscopy.  Immunizations discussed and administered were appropriate  2.  Depression with element of generalized anxiety.  Ongoing.  Patient wishes to stay on his current regimen.  He does not want additional medication.  Compliance discussed.

## 2017-12-24 ENCOUNTER — Telehealth: Payer: Self-pay | Admitting: Family Medicine

## 2017-12-24 NOTE — Telephone Encounter (Signed)
Requesting Rx for Sildenafil.  He seen Dr. Richardson Landry yesterday.   [facility]

## 2017-12-24 NOTE — Telephone Encounter (Signed)
Same rx then 11 ref

## 2017-12-25 ENCOUNTER — Other Ambulatory Visit: Payer: Self-pay | Admitting: *Deleted

## 2017-12-25 MED ORDER — SILDENAFIL CITRATE 20 MG PO TABS: ORAL_TABLET | ORAL | 11 refills | Status: DC

## 2017-12-25 NOTE — Telephone Encounter (Signed)
Pt.notified

## 2017-12-25 NOTE — Telephone Encounter (Signed)
Refills sent. Left message on voicemail to notify pt

## 2017-12-31 DIAGNOSIS — R69 Illness, unspecified: Secondary | ICD-10-CM | POA: Diagnosis not present

## 2018-03-18 DIAGNOSIS — C44612 Basal cell carcinoma of skin of right upper limb, including shoulder: Secondary | ICD-10-CM | POA: Diagnosis not present

## 2018-03-18 DIAGNOSIS — Z1283 Encounter for screening for malignant neoplasm of skin: Secondary | ICD-10-CM | POA: Diagnosis not present

## 2018-03-18 DIAGNOSIS — C4441 Basal cell carcinoma of skin of scalp and neck: Secondary | ICD-10-CM | POA: Diagnosis not present

## 2018-03-18 DIAGNOSIS — D225 Melanocytic nevi of trunk: Secondary | ICD-10-CM | POA: Diagnosis not present

## 2018-04-20 DIAGNOSIS — R69 Illness, unspecified: Secondary | ICD-10-CM | POA: Diagnosis not present

## 2018-05-10 ENCOUNTER — Ambulatory Visit (INDEPENDENT_AMBULATORY_CARE_PROVIDER_SITE_OTHER): Payer: Medicare HMO | Admitting: Family Medicine

## 2018-05-10 ENCOUNTER — Encounter: Payer: Self-pay | Admitting: Family Medicine

## 2018-05-10 VITALS — BP 118/78 | Temp 98.1°F | Ht 65.0 in | Wt 138.0 lb

## 2018-05-10 DIAGNOSIS — J329 Chronic sinusitis, unspecified: Secondary | ICD-10-CM

## 2018-05-10 MED ORDER — CEFPROZIL 500 MG PO TABS: 500.0000 mg | ORAL_TABLET | Freq: Two times a day (BID) | ORAL | 0 refills | Status: DC

## 2018-05-10 NOTE — Progress Notes (Signed)
   Subjective:    Patient ID: Jonathan Bryant, male    DOB: [birth year], 72 y.o.   MRN: [id number]  HPI Patient is here today with complaints of a headache and sinus infection, cough,runny nose,sore throat since last Thursday. He states he has been taking aleve and sinus medication.  Kicked in about a week ago   No nasal disch or gunkiness   trhoat is painful   h  Frontal headCHE, WORSE WITH COUGH, MIN RADIATION   APPETITE OK   THROAT SORE, WORSE IN THE MORN, IN TH NECK ATEA   No major allergy issues       Review of Systems No headache, no major weight loss or weight gain, no chest pain no back pain abdominal pain no change in bowel habits complete ROS otherwise negative     Objective:   Physical Exam  Alert, mild malaise. Hydration good Vitals stable. frontal/ maxillary tenderness evident positive nasal congestion. pharynx normal neck supple  lungs clear/no crackles or wheezes. heart regular in rhythm       Assessment & Plan:  Impression rhinosinusitis likely post viral, discussed with patient. plan antibiotics prescribed. Questions answered. Symptomatic care discussed. warning signs discussed. WSL

## 2018-06-23 ENCOUNTER — Encounter: Payer: Self-pay | Admitting: Family Medicine

## 2018-06-23 ENCOUNTER — Ambulatory Visit (INDEPENDENT_AMBULATORY_CARE_PROVIDER_SITE_OTHER): Payer: Medicare HMO | Admitting: Family Medicine

## 2018-06-23 VITALS — BP 112/80 | Ht 65.0 in | Wt 139.6 lb

## 2018-06-23 DIAGNOSIS — F411 Generalized anxiety disorder: Secondary | ICD-10-CM | POA: Diagnosis not present

## 2018-06-23 DIAGNOSIS — R69 Illness, unspecified: Secondary | ICD-10-CM | POA: Diagnosis not present

## 2018-06-23 DIAGNOSIS — F5101 Primary insomnia: Secondary | ICD-10-CM | POA: Diagnosis not present

## 2018-06-23 DIAGNOSIS — B349 Viral infection, unspecified: Secondary | ICD-10-CM | POA: Diagnosis not present

## 2018-06-23 MED ORDER — ALPRAZOLAM 1 MG PO TABS: ORAL_TABLET | ORAL | 5 refills | Status: DC

## 2018-06-23 MED ORDER — SILDENAFIL CITRATE 20 MG PO TABS: ORAL_TABLET | ORAL | 5 refills | Status: DC

## 2018-06-23 MED ORDER — ESCITALOPRAM OXALATE 20 MG PO TABS: 20.0000 mg | ORAL_TABLET | Freq: Every day | ORAL | 5 refills | Status: DC

## 2018-06-23 NOTE — Progress Notes (Signed)
   Subjective:    Patient ID: Jonathan Bryant, male    DOB: Oct 02, 1945, 72 y.o.   MRN: [id number]  HPI Pt is here today for 6 month follow up.  Pt states he has been doing OK since last visit. Pt would like to know what provider recommends for OTC treatment for a cold.  exrcising thee dapys per wk, does it regular, pt notes feels better with exrtion    Patient compliant with insomnia medication. Generally takes most nights. No obvious morning drowsiness. Definitely helps patient sleep. Without it patient states would not get a good nights rest.  Patient notes ongoing compliance with antidepressant medication. No obvious side effects. Reports does not miss a dose. Overall continues to help depression substantially. No thoughts of homicide or suicide. Would like to maintain medication. Cold symtoms past couple fdays   Review of Systems No headache, no major weight loss or weight gain, no chest pain no back pain abdominal pain no change in bowel habits complete ROS otherwise negative      Objective:   Physical Exam   Alert vitals stable, NAD. Blood pressure good on repeat. HEENT normal. Lungs clear. Heart regular rate and rhythm. Notes some runny nose and dim enrgy   soene sinus cong  Alert and oriented, vitals reviewed and stable, NAD ENT-TM's and ext canals WNL bilat via otoscopic exam Soft palate, tonsils and post pharynx WNL via oropharyngeal exam Neck-symmetric, no masses; thyroid nonpalpable and nontender Pulmonary-no tachypnea or accessory muscle use; Clear without wheezes via auscultation Card--no abnrml murmurs, rhythm reg and rate WNL Carotid pulses symmetric, without bruits     Assessment & Plan:  Impression 1 chronic depression.  Fair control per patient.  No thoughts of self-harm.  Would like to stay in the same dose of medication.  Perhaps slightly more down the last couple weeks.  No clear etiology  2.  Insomnia ongoing definite need for meds meds refilled  3.   URI.  Discussed.  Symptom care discussed.  Signs discussed  Medications refilled.  Flu shot already given.  Diet exercise discussed

## 2018-06-23 NOTE — Patient Instructions (Addendum)
Dimetapp plain for cold symtoms  May add robitussin dm for cough

## 2018-07-14 DIAGNOSIS — R69 Illness, unspecified: Secondary | ICD-10-CM | POA: Diagnosis not present

## 2018-10-08 ENCOUNTER — Encounter: Payer: Self-pay | Admitting: Family Medicine

## 2018-10-08 ENCOUNTER — Ambulatory Visit (INDEPENDENT_AMBULATORY_CARE_PROVIDER_SITE_OTHER): Payer: Medicare HMO | Admitting: Family Medicine

## 2018-10-08 VITALS — BP 118/72 | Temp 98.4°F | Ht 65.0 in | Wt 142.0 lb

## 2018-10-08 DIAGNOSIS — J329 Chronic sinusitis, unspecified: Secondary | ICD-10-CM | POA: Diagnosis not present

## 2018-10-08 MED ORDER — CEFPROZIL 500 MG PO TABS: 500.0000 mg | ORAL_TABLET | Freq: Two times a day (BID) | ORAL | 0 refills | Status: DC

## 2018-10-08 NOTE — Progress Notes (Signed)
   Subjective:    Patient ID: Jonathan Bryant, male    DOB: 06/13/1946, 73 y.o.   MRN: [id number]  Sinusitis  This is a new problem. The current episode started yesterday. Associated symptoms include congestion, coughing, ear pain, headaches and a sore throat. Pertinent negatives include no chills. Treatments tried: ibuprofen.    Significant sore throat head congestion drainage present for 1 to 2 days no wheezing difficulty breathing  Review of Systems  Constitutional: Negative for activity change, chills and fever.  HENT: Positive for congestion, ear pain, rhinorrhea, sore throat and voice change.   Eyes: Negative for discharge.  Respiratory: Positive for cough. Negative for wheezing.   Cardiovascular: Negative for chest pain.  Gastrointestinal: Negative for nausea and vomiting.  Musculoskeletal: Negative for arthralgias.  Neurological: Positive for headaches.       Objective:   Physical Exam Vitals signs and nursing note reviewed.  Constitutional:      Appearance: He is well-developed.  HENT:     Head: Normocephalic.     Mouth/Throat:     Pharynx: No oropharyngeal exudate.  Neck:     Musculoskeletal: Normal range of motion.  Cardiovascular:     Rate and Rhythm: Normal rate and regular rhythm.     Heart sounds: Normal heart sounds. No murmur.  Pulmonary:     Effort: Pulmonary effort is normal.     Breath sounds: Normal breath sounds. No wheezing.  Lymphadenopathy:     Cervical: No cervical adenopathy.  Skin:    General: Skin is warm and dry.  Neurological:     Motor: No abnormal muscle tone.           Assessment & Plan:  Acute rhinosinusitis No need for any type of major intervention A prescription of antibiotics was printed and given to the patient that if over the course of the next 24 to 48 hours it gets progressively worse he can get the prescription filled otherwise I would recommend he give it a few days to see if it will run its own course and go away No  sign of the flu warning signs were discussed in detail

## 2018-12-23 ENCOUNTER — Telehealth: Payer: Self-pay | Admitting: Family Medicine

## 2018-12-23 MED ORDER — ALPRAZOLAM 1 MG PO TABS: ORAL_TABLET | ORAL | 2 refills | Status: DC

## 2018-12-23 NOTE — Telephone Encounter (Signed)
Patient is requesting refill on xanax 1 mg almost out had appointment for physical/chronic for 5/18 but had to reschedule until 6/25 so he needs this prescription refill at [facility]

## 2018-12-23 NOTE — Telephone Encounter (Signed)
Script printed; awaiting signature. Pt is aware that we will fax to pharmacy once signed. Pt verbalized understanding.

## 2018-12-23 NOTE — Telephone Encounter (Signed)
Sure 3 mo

## 2018-12-27 ENCOUNTER — Encounter: Payer: Medicare HMO | Admitting: Family Medicine

## 2019-01-18 DIAGNOSIS — R69 Illness, unspecified: Secondary | ICD-10-CM | POA: Diagnosis not present

## 2019-02-09 ENCOUNTER — Telehealth: Payer: Self-pay | Admitting: Family Medicine

## 2019-02-09 DIAGNOSIS — Z13220 Encounter for screening for lipoid disorders: Secondary | ICD-10-CM

## 2019-02-09 DIAGNOSIS — Z125 Encounter for screening for malignant neoplasm of prostate: Secondary | ICD-10-CM

## 2019-02-09 DIAGNOSIS — Z0000 Encounter for general adult medical examination without abnormal findings: Secondary | ICD-10-CM

## 2019-02-09 DIAGNOSIS — Z79899 Other long term (current) drug therapy: Secondary | ICD-10-CM

## 2019-02-09 MED ORDER — ESCITALOPRAM OXALATE 20 MG PO TABS: 20.0000 mg | ORAL_TABLET | Freq: Every day | ORAL | 1 refills | Status: DC

## 2019-02-09 NOTE — Telephone Encounter (Signed)
Pt needs lab orders for his CPE 7/23.   Pt needs a refill on escitalopram (LEXAPRO) 20 MG tablet. He runs out July 15th  Please send to [place], [id number]

## 2019-02-09 NOTE — Telephone Encounter (Signed)
Last labs were lipid, liver, met 7, cbc and psa on 11/27/2017. Please advise. Thank you

## 2019-02-09 NOTE — Telephone Encounter (Signed)
Lab orders placed, medication sent to pharmacy. Pt notified

## 2019-02-09 NOTE — Telephone Encounter (Signed)
Rep sameblood/ one mo worth

## 2019-02-21 DIAGNOSIS — Z13220 Encounter for screening for lipoid disorders: Secondary | ICD-10-CM | POA: Diagnosis not present

## 2019-02-21 DIAGNOSIS — Z0000 Encounter for general adult medical examination without abnormal findings: Secondary | ICD-10-CM | POA: Diagnosis not present

## 2019-02-21 DIAGNOSIS — Z79899 Other long term (current) drug therapy: Secondary | ICD-10-CM | POA: Diagnosis not present

## 2019-02-21 DIAGNOSIS — Z125 Encounter for screening for malignant neoplasm of prostate: Secondary | ICD-10-CM | POA: Diagnosis not present

## 2019-02-22 LAB — LIPID PANEL
Chol/HDL Ratio: 3.4 ratio (ref 0.0–5.0)
Cholesterol, Total: 185 mg/dL (ref 100–199)
HDL: 55 mg/dL (ref >39)
LDL Calculated: 115 mg/dL — ABNORMAL HIGH (ref 0–99)
Triglycerides: 77 mg/dL (ref 0–149)
VLDL Cholesterol Cal: 15 mg/dL (ref 5–40)

## 2019-02-22 LAB — PSA: Prostate Specific Ag, Serum: 1.3 ng/mL (ref 0.0–4.0)

## 2019-02-22 LAB — BASIC METABOLIC PANEL
BUN/Creatinine Ratio: 14 (ref 10–24)
BUN: 12 mg/dL (ref 8–27)
CO2: 24 mmol/L (ref 20–29)
Calcium: 9.2 mg/dL (ref 8.6–10.2)
Chloride: 102 mmol/L (ref 96–106)
Creatinine, Ser: 0.85 mg/dL (ref 0.76–1.27)
GFR calc Af Amer: 101 mL/min/{1.73_m2} (ref >59)
GFR calc non Af Amer: 88 mL/min/{1.73_m2} (ref >59)
Glucose: 98 mg/dL (ref 65–99)
Potassium: 5 mmol/L (ref 3.5–5.2)
Sodium: 141 mmol/L (ref 134–144)

## 2019-02-22 LAB — CBC WITH DIFFERENTIAL/PLATELET
Basophils Absolute: 0 10*3/uL (ref 0.0–0.2)
Basos: 1 %
EOS (ABSOLUTE): 0.2 10*3/uL (ref 0.0–0.4)
Eos: 5 %
Hematocrit: 47.6 % (ref 37.5–51.0)
Hemoglobin: 16.4 g/dL (ref 13.0–17.7)
Immature Grans (Abs): 0 10*3/uL (ref 0.0–0.1)
Immature Granulocytes: 0 %
Lymphocytes Absolute: 1.3 10*3/uL (ref 0.7–3.1)
Lymphs: 25 %
MCH: 29.9 pg (ref 26.6–33.0)
MCHC: 34.5 g/dL (ref 31.5–35.7)
MCV: 87 fL (ref 79–97)
Monocytes Absolute: 0.4 10*3/uL (ref 0.1–0.9)
Monocytes: 7 %
Neutrophils Absolute: 3.2 10*3/uL (ref 1.4–7.0)
Neutrophils: 62 %
Platelets: 148 10*3/uL — ABNORMAL LOW (ref 150–450)
RBC: 5.49 x10E6/uL (ref 4.14–5.80)
RDW: 12.3 % (ref 11.6–15.4)
WBC: 5.1 10*3/uL (ref 3.4–10.8)

## 2019-02-22 LAB — HEPATIC FUNCTION PANEL
ALT: 16 IU/L (ref 0–44)
AST: 17 IU/L (ref 0–40)
Albumin: 4.6 g/dL (ref 3.7–4.7)
Alkaline Phosphatase: 92 IU/L (ref 39–117)
Bilirubin Total: 0.7 mg/dL (ref 0.0–1.2)
Bilirubin, Direct: 0.15 mg/dL (ref 0.00–0.40)
Total Protein: 6.8 g/dL (ref 6.0–8.5)

## 2019-03-03 ENCOUNTER — Encounter: Payer: Self-pay | Admitting: Family Medicine

## 2019-03-03 ENCOUNTER — Ambulatory Visit (INDEPENDENT_AMBULATORY_CARE_PROVIDER_SITE_OTHER): Payer: Medicare HMO | Admitting: Family Medicine

## 2019-03-03 ENCOUNTER — Other Ambulatory Visit: Payer: Self-pay

## 2019-03-03 VITALS — BP 110/76 | Temp 97.2°F | Ht 65.5 in | Wt 136.0 lb

## 2019-03-03 DIAGNOSIS — R69 Illness, unspecified: Secondary | ICD-10-CM | POA: Diagnosis not present

## 2019-03-03 DIAGNOSIS — F411 Generalized anxiety disorder: Secondary | ICD-10-CM | POA: Diagnosis not present

## 2019-03-03 DIAGNOSIS — F5101 Primary insomnia: Secondary | ICD-10-CM | POA: Diagnosis not present

## 2019-03-03 DIAGNOSIS — F321 Major depressive disorder, single episode, moderate: Secondary | ICD-10-CM

## 2019-03-03 DIAGNOSIS — Z0000 Encounter for general adult medical examination without abnormal findings: Secondary | ICD-10-CM

## 2019-03-03 MED ORDER — ESCITALOPRAM OXALATE 20 MG PO TABS: 20.0000 mg | ORAL_TABLET | Freq: Every day | ORAL | 5 refills | Status: DC

## 2019-03-03 MED ORDER — ALPRAZOLAM 1 MG PO TABS: ORAL_TABLET | ORAL | 5 refills | Status: DC

## 2019-03-03 NOTE — Patient Instructions (Signed)
Results for orders placed or performed in visit on 02/09/19  Lipid Profile  Result Value Ref Range   Cholesterol, Total 185 100 - 199 mg/dL   Triglycerides 77 0 - 149 mg/dL   HDL 55 >39 mg/dL   VLDL Cholesterol Cal 15 5 - 40 mg/dL   LDL Calculated 115 (H) 0 - 99 mg/dL   Chol/HDL Ratio 3.4 0.0 - 5.0 ratio  Hepatic function panel  Result Value Ref Range   Total Protein 6.8 6.0 - 8.5 g/dL   Albumin 4.6 3.7 - 4.7 g/dL   Bilirubin Total 0.7 0.0 - 1.2 mg/dL   Bilirubin, Direct 0.15 0.00 - 0.40 mg/dL   Alkaline Phosphatase 92 39 - 117 IU/L   AST 17 0 - 40 IU/L   ALT 16 0 - 44 IU/L  Basic Metabolic Panel (BMET)  Result Value Ref Range   Glucose 98 65 - 99 mg/dL   BUN 12 8 - 27 mg/dL   Creatinine, Ser 0.85 0.76 - 1.27 mg/dL   GFR calc non Af Amer 88 >59 mL/min/1.73   GFR calc Af Amer 101 >59 mL/min/1.73   BUN/Creatinine Ratio 14 10 - 24   Sodium 141 134 - 144 mmol/L   Potassium 5.0 3.5 - 5.2 mmol/L   Chloride 102 96 - 106 mmol/L   CO2 24 20 - 29 mmol/L   Calcium 9.2 8.6 - 10.2 mg/dL  CBC with Differential  Result Value Ref Range   WBC 5.1 3.4 - 10.8 x10E3/uL   RBC 5.49 4.14 - 5.80 x10E6/uL   Hemoglobin 16.4 13.0 - 17.7 g/dL   Hematocrit 47.6 37.5 - 51.0 %   MCV 87 79 - 97 fL   MCH 29.9 26.6 - 33.0 pg   MCHC 34.5 31.5 - 35.7 g/dL   RDW 12.3 11.6 - 15.4 %   Platelets 148 (L) 150 - 450 x10E3/uL   Neutrophils 62 Not Estab. %   Lymphs 25 Not Estab. %   Monocytes 7 Not Estab. %   Eos 5 Not Estab. %   Basos 1 Not Estab. %   Neutrophils Absolute 3.2 1.4 - 7.0 x10E3/uL   Lymphocytes Absolute 1.3 0.7 - 3.1 x10E3/uL   Monocytes Absolute 0.4 0.1 - 0.9 x10E3/uL   EOS (ABSOLUTE) 0.2 0.0 - 0.4 x10E3/uL   Basophils Absolute 0.0 0.0 - 0.2 x10E3/uL   Immature Granulocytes 0 Not Estab. %   Immature Grans (Abs) 0.0 0.0 - 0.1 x10E3/uL  PSA  Result Value Ref Range   Prostate Specific Ag, Serum 1.3 0.0 - 4.0 ng/mL

## 2019-03-03 NOTE — Progress Notes (Signed)
Subjective:  Wellness plus chronic  Patient ID: Jonathan Bryant, male    DOB: 08/23/1945, 73 y.o.   MRN: [id number]  HPI AWV- Annual Wellness Visit  The patient was seen for their annual wellness visit. The patient's past medical history, surgical history, and family history were reviewed. Pertinent vaccines were reviewed ( tetanus, pneumonia, shingles, flu) The patient's medication list was reviewed and updated.  The height and weight were entered.  BMI recorded in electronic record elsewhere  Cognitive screening was completed. Outcome of Mini - Cog: pass   Falls /depression screening electronically recorded within record elsewhere  Current tobacco usage: none (All patients who use tobacco were given written and verbal information on quitting)  Recent listing of emergency department/hospitalizations over the past year were reviewed.  current specialist the patient sees on a regular basis: none   Medicare annual wellness visit patient questionnaire was reviewed.  A written screening schedule for the patient for the next 5-10 years was given. Appropriate discussion of followup regarding next visit was discussed.  Concerns about getting light headed sometimes. Not very often.   Patient notes ongoing compliance with antidepressant medication. No obvious side effects. Reports does not miss a dose. Overall continues to help depression substantially. No thoughts of homicide or suicide. Would like to maintain medication.  Patient compliant with insomnia medication. Generally takes most nights. No obvious morning drowsiness. Definitely helps patient sleep. Without it patient states would not get a good nights rest.  Overall patient's chronic depression is in decent control.  As is his anxiety disorder.  Uses alprazolam as needed.  Takes the Lexapro faithfully.  Exercising fairly regularly   Review of Systems  Constitutional: Negative for activity change, appetite change and fever.   HENT: Negative for congestion and rhinorrhea.   Eyes: Negative for discharge.  Respiratory: Negative for cough and wheezing.   Cardiovascular: Negative for chest pain.  Gastrointestinal: Negative for abdominal pain, blood in stool and vomiting.  Genitourinary: Negative for difficulty urinating and frequency.  Musculoskeletal: Negative for neck pain.  Skin: Negative for rash.  Allergic/Immunologic: Negative for environmental allergies and food allergies.  Neurological: Negative for weakness and headaches.  Psychiatric/Behavioral: Negative for agitation.  All other systems reviewed and are negative.      Objective:   Physical Exam Constitutional:      Appearance: He is well-developed.  HENT:     Head: Normocephalic and atraumatic.     Right Ear: External ear normal.     Left Ear: External ear normal.     Nose: Nose normal.  Eyes:     Pupils: Pupils are equal, round, and reactive to light.  Neck:     Musculoskeletal: Normal range of motion and neck supple.     Thyroid: No thyromegaly.  Cardiovascular:     Rate and Rhythm: Normal rate and regular rhythm.     Heart sounds: Normal heart sounds. No murmur.  Pulmonary:     Effort: Pulmonary effort is normal. No respiratory distress.     Breath sounds: Normal breath sounds. No wheezing.  Abdominal:     General: Bowel sounds are normal. There is no distension.     Palpations: Abdomen is soft. There is no mass.     Tenderness: There is no abdominal tenderness.  Genitourinary:    Penis: Normal.   Musculoskeletal: Normal range of motion.  Lymphadenopathy:     Cervical: No cervical adenopathy.  Skin:    General: Skin is warm and dry.  Findings: No erythema.  Neurological:     Mental Status: He is alert.     Motor: No abnormal muscle tone.  Psychiatric:        Behavior: Behavior normal.        Judgment: Judgment normal.           Assessment & Plan:  Impression 1 well adult exam.  Up-to-date on colonoscopy.  Diet  discussed.  Exercise discussed.  Blood work discussed.  Up-to-date on vaccinations  2.  Depression clinically stable compliant with medication  3.  Generalized anxiety disorder also clinically stable  4.  Insomnia intermittent medicine manages well  Medications refilled diet exercise discussed blood work discussed

## 2019-03-04 ENCOUNTER — Telehealth: Payer: Self-pay | Admitting: Family Medicine

## 2019-03-04 ENCOUNTER — Other Ambulatory Visit: Payer: Self-pay

## 2019-03-04 MED ORDER — ESCITALOPRAM OXALATE 20 MG PO TABS: 20.0000 mg | ORAL_TABLET | Freq: Every day | ORAL | 5 refills | Status: DC

## 2019-03-04 MED ORDER — ALPRAZOLAM 1 MG PO TABS: ORAL_TABLET | ORAL | 5 refills | Status: DC

## 2019-03-04 NOTE — Telephone Encounter (Signed)
Patient states medications were sent to wrong pharmacy yesterday and would like them sent to [facility]

## 2019-03-04 NOTE — Telephone Encounter (Signed)
Xanax and Lexapro cancelled at [facility] by patient and myself. Sent medications over to [facility]. Pt is aware

## 2019-03-07 ENCOUNTER — Encounter: Payer: Medicare HMO | Admitting: Family Medicine

## 2019-04-20 DIAGNOSIS — H5200 Hypermetropia, unspecified eye: Secondary | ICD-10-CM | POA: Diagnosis not present

## 2019-05-03 DIAGNOSIS — R69 Illness, unspecified: Secondary | ICD-10-CM | POA: Diagnosis not present

## 2019-07-13 DIAGNOSIS — R69 Illness, unspecified: Secondary | ICD-10-CM | POA: Diagnosis not present

## 2019-09-15 ENCOUNTER — Encounter: Payer: Self-pay | Admitting: Family Medicine

## 2019-09-27 ENCOUNTER — Ambulatory Visit (INDEPENDENT_AMBULATORY_CARE_PROVIDER_SITE_OTHER): Payer: Medicare HMO | Admitting: Family Medicine

## 2019-09-27 ENCOUNTER — Other Ambulatory Visit: Payer: Self-pay

## 2019-09-27 ENCOUNTER — Encounter: Payer: Self-pay | Admitting: Family Medicine

## 2019-09-27 VITALS — BP 134/82 | Temp 98.1°F | Ht 65.5 in | Wt 139.0 lb

## 2019-09-27 DIAGNOSIS — F321 Major depressive disorder, single episode, moderate: Secondary | ICD-10-CM

## 2019-09-27 DIAGNOSIS — R69 Illness, unspecified: Secondary | ICD-10-CM | POA: Diagnosis not present

## 2019-09-27 DIAGNOSIS — F411 Generalized anxiety disorder: Secondary | ICD-10-CM

## 2019-09-27 MED ORDER — ESCITALOPRAM OXALATE 20 MG PO TABS: 20.0000 mg | ORAL_TABLET | Freq: Every day | ORAL | 5 refills | Status: DC

## 2019-09-27 MED ORDER — ALPRAZOLAM 1 MG PO TABS: ORAL_TABLET | ORAL | 5 refills | Status: DC

## 2019-09-27 NOTE — Progress Notes (Signed)
   Subjective:    Patient ID: Jonathan Bryant, male    DOB: [birth year], 74 y.o.   MRN: [id number]  Depression        This is a chronic problem.  Pt states no concerns or problems.    Patient notes ongoing compliance with antidepressant medication. No obvious side effects. Reports does not miss a dose. Overall continues to help depression substantially. No thoughts of homicide or suicide. Would like to maintain medication.   Review of Systems  Psychiatric/Behavioral: Positive for depression.       Objective:   Physical Exam  Alert vitals stable, NAD. Blood pressure good on repeat. HEENT normal. Lungs clear. Heart regular rate and rhythm.       Assessment & Plan:  Impression chronic depression with substantial anxiety discussed.  Diet exercise discussed.  Compliance with medication discussed.  Exercise encouraged.  Patient definitely needs both anxiolytic and antidepressant long-term.  Discussed will certainly maintain follow-up in 6 months

## 2019-10-17 ENCOUNTER — Other Ambulatory Visit: Payer: Self-pay

## 2019-10-17 ENCOUNTER — Ambulatory Visit: Payer: Medicare HMO | Attending: Internal Medicine

## 2019-10-17 DIAGNOSIS — Z20822 Contact with and (suspected) exposure to covid-19: Secondary | ICD-10-CM

## 2019-10-18 LAB — NOVEL CORONAVIRUS, NAA: SARS-CoV-2, NAA: NOT DETECTED

## 2020-01-11 DIAGNOSIS — R69 Illness, unspecified: Secondary | ICD-10-CM | POA: Diagnosis not present

## 2020-01-24 ENCOUNTER — Telehealth: Payer: Self-pay | Admitting: Family Medicine

## 2020-01-24 DIAGNOSIS — Z79899 Other long term (current) drug therapy: Secondary | ICD-10-CM

## 2020-01-24 DIAGNOSIS — Z0000 Encounter for general adult medical examination without abnormal findings: Secondary | ICD-10-CM

## 2020-01-24 DIAGNOSIS — Z125 Encounter for screening for malignant neoplasm of prostate: Secondary | ICD-10-CM

## 2020-01-24 DIAGNOSIS — Z13220 Encounter for screening for lipoid disorders: Secondary | ICD-10-CM

## 2020-01-24 NOTE — Telephone Encounter (Signed)
Yes pls order. Thx.

## 2020-01-24 NOTE — Telephone Encounter (Signed)
Last labs completed on 02/21/2019 PSA, CBC, BMET, Hepatic and Lipid. Please advise. Thank you

## 2020-01-24 NOTE — Telephone Encounter (Signed)
Lab orders placed. Pt does not have voicemail set up, unable to leave message

## 2020-01-24 NOTE — Telephone Encounter (Signed)
Pt is scheduled to have a Phy on 03/05/20 wanting to know when he needs lab work done?

## 2020-01-25 NOTE — Telephone Encounter (Signed)
Attempted to contact patient; no voicemail set up unable to leave message

## 2020-01-27 NOTE — Telephone Encounter (Signed)
Pt contacted and verbalized understanding.  

## 2020-02-27 DIAGNOSIS — Z125 Encounter for screening for malignant neoplasm of prostate: Secondary | ICD-10-CM | POA: Diagnosis not present

## 2020-02-27 DIAGNOSIS — Z13220 Encounter for screening for lipoid disorders: Secondary | ICD-10-CM | POA: Diagnosis not present

## 2020-02-27 DIAGNOSIS — Z0000 Encounter for general adult medical examination without abnormal findings: Secondary | ICD-10-CM | POA: Diagnosis not present

## 2020-02-27 DIAGNOSIS — Z79899 Other long term (current) drug therapy: Secondary | ICD-10-CM | POA: Diagnosis not present

## 2020-02-28 LAB — PSA: Prostate Specific Ag, Serum: 1.1 ng/mL (ref 0.0–4.0)

## 2020-02-28 LAB — BASIC METABOLIC PANEL
BUN/Creatinine Ratio: 17 (ref 10–24)
BUN: 15 mg/dL (ref 8–27)
CO2: 25 mmol/L (ref 20–29)
Calcium: 9 mg/dL (ref 8.6–10.2)
Chloride: 102 mmol/L (ref 96–106)
Creatinine, Ser: 0.86 mg/dL (ref 0.76–1.27)
GFR calc Af Amer: 100 mL/min/{1.73_m2} (ref >59)
GFR calc non Af Amer: 87 mL/min/{1.73_m2} (ref >59)
Glucose: 100 mg/dL — ABNORMAL HIGH (ref 65–99)
Potassium: 4.7 mmol/L (ref 3.5–5.2)
Sodium: 139 mmol/L (ref 134–144)

## 2020-02-28 LAB — CBC WITH DIFFERENTIAL/PLATELET
Basophils Absolute: 0 10*3/uL (ref 0.0–0.2)
Basos: 1 %
EOS (ABSOLUTE): 0.4 10*3/uL (ref 0.0–0.4)
Eos: 7 %
Hematocrit: 48 % (ref 37.5–51.0)
Hemoglobin: 16.7 g/dL (ref 13.0–17.7)
Immature Grans (Abs): 0 10*3/uL (ref 0.0–0.1)
Immature Granulocytes: 0 %
Lymphocytes Absolute: 1.3 10*3/uL (ref 0.7–3.1)
Lymphs: 25 %
MCH: 31.1 pg (ref 26.6–33.0)
MCHC: 34.8 g/dL (ref 31.5–35.7)
MCV: 89 fL (ref 79–97)
Monocytes Absolute: 0.4 10*3/uL (ref 0.1–0.9)
Monocytes: 8 %
Neutrophils Absolute: 3.1 10*3/uL (ref 1.4–7.0)
Neutrophils: 59 %
Platelets: 156 10*3/uL (ref 150–450)
RBC: 5.37 x10E6/uL (ref 4.14–5.80)
RDW: 12.1 % (ref 11.6–15.4)
WBC: 5.3 10*3/uL (ref 3.4–10.8)

## 2020-02-28 LAB — LIPID PANEL
Chol/HDL Ratio: 3.9 ratio (ref 0.0–5.0)
Cholesterol, Total: 212 mg/dL — ABNORMAL HIGH (ref 100–199)
HDL: 54 mg/dL (ref >39)
LDL Chol Calc (NIH): 143 mg/dL — ABNORMAL HIGH (ref 0–99)
Triglycerides: 82 mg/dL (ref 0–149)
VLDL Cholesterol Cal: 15 mg/dL (ref 5–40)

## 2020-02-28 LAB — HEPATIC FUNCTION PANEL
ALT: 13 IU/L (ref 0–44)
AST: 14 IU/L (ref 0–40)
Albumin: 4.5 g/dL (ref 3.7–4.7)
Alkaline Phosphatase: 99 IU/L (ref 48–121)
Bilirubin Total: 0.7 mg/dL (ref 0.0–1.2)
Bilirubin, Direct: 0.18 mg/dL (ref 0.00–0.40)
Total Protein: 6.8 g/dL (ref 6.0–8.5)

## 2020-03-05 ENCOUNTER — Encounter: Payer: Self-pay | Admitting: Family Medicine

## 2020-03-05 ENCOUNTER — Ambulatory Visit (INDEPENDENT_AMBULATORY_CARE_PROVIDER_SITE_OTHER): Payer: Medicare HMO | Admitting: Family Medicine

## 2020-03-05 ENCOUNTER — Other Ambulatory Visit: Payer: Self-pay

## 2020-03-05 VITALS — BP 132/82 | HR 82 | Temp 94.9°F | Ht 65.5 in | Wt 131.2 lb

## 2020-03-05 DIAGNOSIS — F5101 Primary insomnia: Secondary | ICD-10-CM | POA: Diagnosis not present

## 2020-03-05 DIAGNOSIS — F321 Major depressive disorder, single episode, moderate: Secondary | ICD-10-CM | POA: Diagnosis not present

## 2020-03-05 DIAGNOSIS — R69 Illness, unspecified: Secondary | ICD-10-CM | POA: Diagnosis not present

## 2020-03-05 DIAGNOSIS — Z0000 Encounter for general adult medical examination without abnormal findings: Secondary | ICD-10-CM

## 2020-03-05 DIAGNOSIS — F411 Generalized anxiety disorder: Secondary | ICD-10-CM | POA: Diagnosis not present

## 2020-03-05 MED ORDER — ESCITALOPRAM OXALATE 20 MG PO TABS: 20.0000 mg | ORAL_TABLET | Freq: Every day | ORAL | 1 refills | Status: DC

## 2020-03-05 MED ORDER — TRAZODONE HCL 150 MG PO TABS: ORAL_TABLET | ORAL | 0 refills | Status: DC

## 2020-03-05 NOTE — Patient Instructions (Addendum)
With the 1mg  xanax-  Take 1/2 xanax 3x per day. For 7 days.  Then stop the night time dose of xanax. Until runs out. Take trazodone at night, 1/2 -1 tablet bedtime. During the day continue with 1/2 tablet xanax.

## 2020-03-05 NOTE — Progress Notes (Signed)
Patient ID: Jonathan Bryant, male    DOB: 03-02-1946, 74 y.o.   MRN: [id number]   Chief Complaint  Patient presents with  . Annual Exam   Subjective:    HPI AWV- Annual Wellness Visit  The patient was seen for their annual wellness visit. The patient's past medical history, surgical history, and family history were reviewed. Pertinent vaccines were reviewed ( tetanus, pneumonia, shingles, flu) The patient's medication list was reviewed and updated.  The height and weight were entered.  BMI recorded in electronic record elsewhere  Cognitive screening was completed. Outcome of Mini - Cog: pass   Falls /depression screening electronically recorded within record elsewhere  Current tobacco usage:none (All patients who use tobacco were given written and verbal information on quitting)  Recent listing of emergency department/hospitalizations over the past year were reviewed.  current specialist the patient sees on a regular basis: none   Medicare annual wellness visit patient questionnaire was reviewed.  A written screening schedule for the patient for the next 5-10 years was given. Appropriate discussion of followup regarding next visit was discussed.  Anxiety- Xanax 1mg  and lexpro 20mg  daily. 1/2 am and 1/2 pm and 1 mg at night to sleep.pt stating has tried at times to wean himself off. Did take 1/2 tab in pm to help with sleep.   HM- Due next year for colonoscopy, wanting to see Dr. Laural Golden, GI.  Medical History Jonathan Bryant has a past medical history of GERD (gastroesophageal reflux disease) and Impaired fasting glucose.   Outpatient Encounter Medications as of 03/05/2020  Medication Sig  . ALPRAZolam (XANAX) 1 MG tablet TAKE 1/2 TABLET BY MOUTH TWICE DAILY AND 1 TABLET BY MOUTH EVERY NIGHT AT BEDTIME AS NEEDED  . escitalopram (LEXAPRO) 20 MG tablet Take 1 tablet (20 mg total) by mouth daily.  . [DISCONTINUED] escitalopram (LEXAPRO) 20 MG tablet Take 1 tablet (20 mg  total) by mouth daily.  . traZODone (DESYREL) 150 MG tablet Take 1/2 to 1 tab p.o. qhs   No facility-administered encounter medications on file as of 03/05/2020.     Review of Systems  Constitutional: Negative for chills and fever.  HENT: Negative for congestion, rhinorrhea and sore throat.   Respiratory: Negative for cough, shortness of breath and wheezing.   Cardiovascular: Negative for chest pain and leg swelling.  Gastrointestinal: Negative for abdominal pain, diarrhea, nausea and vomiting.  Genitourinary: Negative for dysuria and frequency.  Skin: Negative for rash.  Neurological: Negative for dizziness, weakness and headaches.  Psychiatric/Behavioral: Positive for sleep disturbance. Negative for agitation, confusion and dysphoric mood. The patient is nervous/anxious.      Vitals BP (!) 132/82   Pulse 82   Temp (!) 94.9 F (34.9 C)   Ht 5' 5.5" (1.664 m)   Wt 131 lb 3.2 oz (59.5 kg)   SpO2 97%   BMI 21.50 kg/m   Objective:   Physical Exam Vitals reviewed.  Constitutional:      Appearance: Normal appearance.  HENT:     Head: Normocephalic.     Nose: Nose normal. No congestion.     Mouth/Throat:     Mouth: Mucous membranes are moist.     Pharynx: No oropharyngeal exudate.  Eyes:     Extraocular Movements: Extraocular movements intact.     Conjunctiva/sclera: Conjunctivae normal.     Pupils: Pupils are equal, round, and reactive to light.  Cardiovascular:     Rate and Rhythm: Normal rate and regular rhythm.  Pulses: Normal pulses.     Heart sounds: Normal heart sounds. No murmur heard.   Pulmonary:     Effort: Pulmonary effort is normal. No respiratory distress.     Breath sounds: Normal breath sounds. No wheezing, rhonchi or rales.  Musculoskeletal:        General: Normal range of motion.     Right lower leg: No edema.     Left lower leg: No edema.  Skin:    General: Skin is warm and dry.     Findings: No rash.  Neurological:     General: No focal  deficit present.     Mental Status: He is alert and oriented to person, place, and time.     Cranial Nerves: No cranial nerve deficit.  Psychiatric:        Behavior: Behavior normal.        Thought Content: Thought content normal.        Judgment: Judgment normal.     Comments: +anxious mood      Assessment and Plan   1. Medicare annual wellness visit, subsequent  2. Generalized anxiety disorder - traZODone (DESYREL) 150 MG tablet; Take 1/2 to 1 tab p.o. qhs  Dispense: 45 tablet; Refill: 0 - escitalopram (LEXAPRO) 20 MG tablet; Take 1 tablet (20 mg total) by mouth daily.  Dispense: 30 tablet; Refill: 1  3. Current moderate episode of major depressive disorder without prior episode (HCC) - traZODone (DESYREL) 150 MG tablet; Take 1/2 to 1 tab p.o. qhs  Dispense: 45 tablet; Refill: 0 - escitalopram (LEXAPRO) 20 MG tablet; Take 1 tablet (20 mg total) by mouth daily.  Dispense: 30 tablet; Refill: 1  4. Primary insomnia - traZODone (DESYREL) 150 MG tablet; Take 1/2 to 1 tab p.o. qhs  Dispense: 45 tablet; Refill: 0    Reviewed with pt to try to decrease the xanax and reviewed safety concerns of taking these medications in pt over 81 yrs old.  Reviewed risk vs benefit and alternatives to taking medications for anxiety and insomnia. Pt advised to taper off the xanax and given a tapering handout and pt to take trazodone for sleep.  Pt in agreement.  F/u in 3month or prn.

## 2020-04-09 ENCOUNTER — Ambulatory Visit (INDEPENDENT_AMBULATORY_CARE_PROVIDER_SITE_OTHER): Payer: Medicare HMO | Admitting: Family Medicine

## 2020-04-09 ENCOUNTER — Other Ambulatory Visit: Payer: Self-pay

## 2020-04-09 VITALS — BP 122/82 | HR 82 | Temp 98.6°F | Ht 65.5 in | Wt 132.2 lb

## 2020-04-09 DIAGNOSIS — F321 Major depressive disorder, single episode, moderate: Secondary | ICD-10-CM

## 2020-04-09 DIAGNOSIS — F411 Generalized anxiety disorder: Secondary | ICD-10-CM | POA: Diagnosis not present

## 2020-04-09 DIAGNOSIS — R69 Illness, unspecified: Secondary | ICD-10-CM | POA: Diagnosis not present

## 2020-04-09 DIAGNOSIS — F5101 Primary insomnia: Secondary | ICD-10-CM

## 2020-04-09 MED ORDER — ESCITALOPRAM OXALATE 20 MG PO TABS: 20.0000 mg | ORAL_TABLET | Freq: Every day | ORAL | 1 refills | Status: AC

## 2020-04-09 MED ORDER — ALPRAZOLAM 0.5 MG PO TABS: ORAL_TABLET | ORAL | 0 refills | Status: DC

## 2020-04-09 NOTE — Progress Notes (Signed)
Patient ID: Jonathan Bryant, male    DOB: [birth year], 74 y.o.   MRN: [id number]   Chief Complaint  Patient presents with  . insomnia follow up    Patient had to stop Trazodone due to side effects- couldn't sleep, couldnt concentrate or focus- uncoordinated   Subjective:    HPI PT seen for f/u on insomnia.  Had a trial of trazodone.  Pt feeling next monring couldn't concentrate and felt uncoordinated.  Pt stating didn't get any sleep with this. Pt was on xanax for sleep.  Was taking 1mg  xanax 1/2 in am and 1/ 2 pm and 1mg  at night for sleep and anxiety.  Pt was advised on last visit to start to wean off the xanax.   Long discussion of safety concerns of being on this medication long term due to risk of memory deficits, dementia, or falls in pt over 72 yrs old.  Pt stating he was on the xanax for a long time due to a concern of feeling like he "couldn't swallow and a lump sensation" in his throat.  Medical History Jonathan Bryant has a past medical history of GERD (gastroesophageal reflux disease) and Impaired fasting glucose.   Outpatient Encounter Medications as of 04/09/2020  Medication Sig  . ALPRAZolam (XANAX) 0.5 MG tablet Take 1 tab daily prn anxiety.  Marland Kitchen escitalopram (LEXAPRO) 20 MG tablet Take 1 tablet (20 mg total) by mouth daily.  . [DISCONTINUED] ALPRAZolam (XANAX) 1 MG tablet TAKE 1/2 TABLET BY MOUTH TWICE DAILY AND 1 TABLET BY MOUTH EVERY NIGHT AT BEDTIME AS NEEDED  . [DISCONTINUED] escitalopram (LEXAPRO) 20 MG tablet Take 1 tablet (20 mg total) by mouth daily.  . [DISCONTINUED] traZODone (DESYREL) 150 MG tablet Take 1/2 to 1 tab p.o. qhs (Patient not taking: Reported on 04/09/2020)   No facility-administered encounter medications on file as of 04/09/2020.     Review of Systems  Constitutional: Negative for chills and fever.  HENT: Negative for congestion, rhinorrhea and sore throat.   Respiratory: Negative for cough, shortness of breath and wheezing.   Cardiovascular: Negative  for chest pain and leg swelling.  Gastrointestinal: Negative for abdominal pain, diarrhea, nausea and vomiting.  Genitourinary: Negative for dysuria and frequency.  Skin: Negative for rash.  Neurological: Negative for dizziness, weakness and headaches.  Psychiatric/Behavioral: Positive for sleep disturbance. Negative for behavioral problems, decreased concentration, hallucinations and suicidal ideas. The patient is nervous/anxious. The patient is not hyperactive.      Vitals BP 122/82   Pulse 82   Temp 98.6 F (37 C) (Oral)   Ht 5' 5.5" (1.664 m)   Wt 132 lb 3.2 oz (60 kg)   SpO2 95%   BMI 21.66 kg/m   Objective:   Physical Exam Vitals and nursing note reviewed.  Constitutional:      General: He is not in acute distress.    Appearance: Normal appearance. He is not ill-appearing.  HENT:     Head: Normocephalic.     Nose: Nose normal.     Mouth/Throat:     Mouth: Mucous membranes are moist.     Pharynx: No oropharyngeal exudate.  Eyes:     Extraocular Movements: Extraocular movements intact.     Conjunctiva/sclera: Conjunctivae normal.     Pupils: Pupils are equal, round, and reactive to light.  Pulmonary:     Effort: Pulmonary effort is normal. No respiratory distress.     Breath sounds: No rhonchi.  Musculoskeletal:        General:  Normal range of motion.     Right lower leg: No edema.     Left lower leg: No edema.  Skin:    General: Skin is warm and dry.     Findings: No rash.  Neurological:     General: No focal deficit present.     Mental Status: He is alert and oriented to person, place, and time.     Cranial Nerves: No cranial nerve deficit.  Psychiatric:        Behavior: Behavior normal.        Thought Content: Thought content normal.        Judgment: Judgment normal.     Comments: +anxious mood and affect      Assessment and Plan   1. Primary insomnia - Ambulatory referral to Psychiatry - ALPRAZolam (XANAX) 0.5 MG tablet; Take 1 tab daily prn  anxiety.  Dispense: 30 tablet; Refill: 0  2. Generalized anxiety disorder - Ambulatory referral to Psychiatry - escitalopram (LEXAPRO) 20 MG tablet; Take 1 tablet (20 mg total) by mouth daily.  Dispense: 30 tablet; Refill: 1 - ALPRAZolam (XANAX) 0.5 MG tablet; Take 1 tab daily prn anxiety.  Dispense: 30 tablet; Refill: 0  3. Current moderate episode of major depressive disorder without prior episode (HCC) - escitalopram (LEXAPRO) 20 MG tablet; Take 1 tablet (20 mg total) by mouth daily.  Dispense: 30 tablet; Refill: 1    Pt given 0.5mg  xanax and advised to taper down on it and if coming back for another refill will be put on a taper.  Pt given referral to psychiatry for GAD and insomnia. Cont lexapro 20mg  daily.  Pt wanting to discontinue trazodone due to side effects/ineffective.  Not wanting to try another medication for sleep. Pt voiced understanding.   F/u prn.

## 2020-04-09 NOTE — Patient Instructions (Signed)
Dr. Harrington Challenger and Dr. Pola Corn- psychiatry in [place] area.

## 2020-04-20 ENCOUNTER — Encounter: Payer: Self-pay | Admitting: Family Medicine

## 2020-04-24 DIAGNOSIS — R69 Illness, unspecified: Secondary | ICD-10-CM | POA: Diagnosis not present

## 2020-04-24 DIAGNOSIS — Z0189 Encounter for other specified special examinations: Secondary | ICD-10-CM | POA: Diagnosis not present

## 2020-04-24 DIAGNOSIS — G4700 Insomnia, unspecified: Secondary | ICD-10-CM | POA: Diagnosis not present

## 2020-05-17 DIAGNOSIS — R69 Illness, unspecified: Secondary | ICD-10-CM | POA: Diagnosis not present

## 2020-06-28 ENCOUNTER — Ambulatory Visit: Payer: Medicare HMO | Attending: Internal Medicine

## 2020-06-28 DIAGNOSIS — Z23 Encounter for immunization: Secondary | ICD-10-CM

## 2020-06-28 NOTE — Progress Notes (Signed)
   Covid-19 Vaccination Clinic  Name:  KRISTEN BUSHWAY    MRN: [id number] DOB: 04/23/1946  06/28/2020  Mr. Davidian was observed post Covid-19 immunization for 15 minutes without incident. He was provided with Vaccine Information Sheet and instruction to access the V-Safe system.   Mr. Flater was instructed to call 911 with any severe reactions post vaccine: Marland Kitchen Difficulty breathing  . Swelling of face and throat  . A fast heartbeat  . A bad rash all over body  . Dizziness and weakness   Immunizations Administered    No immunizations on file.

## 2020-07-17 DIAGNOSIS — R69 Illness, unspecified: Secondary | ICD-10-CM | POA: Diagnosis not present

## 2020-07-26 DIAGNOSIS — Z0189 Encounter for other specified special examinations: Secondary | ICD-10-CM | POA: Diagnosis not present

## 2020-07-26 DIAGNOSIS — G4700 Insomnia, unspecified: Secondary | ICD-10-CM | POA: Diagnosis not present

## 2020-07-26 DIAGNOSIS — Z0001 Encounter for general adult medical examination with abnormal findings: Secondary | ICD-10-CM | POA: Diagnosis not present

## 2020-07-26 DIAGNOSIS — Z125 Encounter for screening for malignant neoplasm of prostate: Secondary | ICD-10-CM | POA: Diagnosis not present

## 2020-07-26 DIAGNOSIS — R69 Illness, unspecified: Secondary | ICD-10-CM | POA: Diagnosis not present

## 2020-08-08 DIAGNOSIS — R69 Illness, unspecified: Secondary | ICD-10-CM | POA: Diagnosis not present

## 2020-08-08 DIAGNOSIS — E782 Mixed hyperlipidemia: Secondary | ICD-10-CM | POA: Diagnosis not present

## 2020-08-08 DIAGNOSIS — G4700 Insomnia, unspecified: Secondary | ICD-10-CM | POA: Diagnosis not present

## 2020-10-04 DIAGNOSIS — L308 Other specified dermatitis: Secondary | ICD-10-CM | POA: Diagnosis not present

## 2020-10-04 DIAGNOSIS — L720 Epidermal cyst: Secondary | ICD-10-CM | POA: Diagnosis not present

## 2020-10-04 DIAGNOSIS — B078 Other viral warts: Secondary | ICD-10-CM | POA: Diagnosis not present

## 2020-10-15 DIAGNOSIS — B370 Candidal stomatitis: Secondary | ICD-10-CM | POA: Diagnosis not present

## 2020-11-27 DIAGNOSIS — R682 Dry mouth, unspecified: Secondary | ICD-10-CM | POA: Diagnosis not present

## 2020-11-27 DIAGNOSIS — K148 Other diseases of tongue: Secondary | ICD-10-CM | POA: Diagnosis not present

## 2020-11-29 ENCOUNTER — Encounter (INDEPENDENT_AMBULATORY_CARE_PROVIDER_SITE_OTHER): Payer: Self-pay | Admitting: *Deleted

## 2020-12-05 ENCOUNTER — Encounter (INDEPENDENT_AMBULATORY_CARE_PROVIDER_SITE_OTHER): Payer: Self-pay | Admitting: *Deleted

## 2020-12-18 ENCOUNTER — Other Ambulatory Visit (INDEPENDENT_AMBULATORY_CARE_PROVIDER_SITE_OTHER): Payer: Self-pay | Admitting: *Deleted

## 2020-12-20 ENCOUNTER — Telehealth (INDEPENDENT_AMBULATORY_CARE_PROVIDER_SITE_OTHER): Payer: Self-pay | Admitting: *Deleted

## 2020-12-20 ENCOUNTER — Encounter (INDEPENDENT_AMBULATORY_CARE_PROVIDER_SITE_OTHER): Payer: Self-pay | Admitting: *Deleted

## 2020-12-20 ENCOUNTER — Other Ambulatory Visit (INDEPENDENT_AMBULATORY_CARE_PROVIDER_SITE_OTHER): Payer: Self-pay

## 2020-12-20 DIAGNOSIS — Z1211 Encounter for screening for malignant neoplasm of colon: Secondary | ICD-10-CM

## 2020-12-20 MED ORDER — PEG 3350-KCL-NA BICARB-NACL 420 G PO SOLR: 4000.0000 mL | Freq: Once | ORAL | 0 refills | Status: AC

## 2020-12-20 NOTE — Telephone Encounter (Signed)
Done

## 2020-12-20 NOTE — Telephone Encounter (Signed)
Patient needs trilyte 

## 2021-01-11 ENCOUNTER — Telehealth (INDEPENDENT_AMBULATORY_CARE_PROVIDER_SITE_OTHER): Payer: Self-pay | Admitting: *Deleted

## 2021-01-11 NOTE — Telephone Encounter (Signed)
Referring MD/PCP: hall  Procedure: tcs w propofol  Reason/Indication:  screening  Has patient had this procedure before?  Yes, 2012  If so, when, by whom and where?    Is there a family history of colon cancer?  no  Who?  What age when diagnosed?    Is patient diabetic? If yes, Type 1 or Type 2   no      Does patient have prosthetic heart valve or mechanical valve?  no  Do you have a pacemaker/defibrillator?  no  Has patient ever had endocarditis/atrial fibrillation? no  Have you had a stroke/heart attack last 6 mths? no  Does patient use oxygen? no  Has patient had joint replacement within last 12 months?  no  Is patient constipated or do they take laxatives? no  Does patient have a history of alcohol/drug use?  no  Is patient on blood thinner such as Coumadin, Plavix and/or Aspirin? no  Do you take medicine for weight loss?  no  For male patients,: do you still have your menstrual cycle? n/a  Medications: escitalopram 20 mg daily, alprazolam 10 mg 1/2 tab bid & 1 tab nightly at bedtime  Allergies: nkda  Medication Adjustment per Dr Rehman/Dr Jenetta Downer   Procedure date & time: 02/06/21

## 2021-01-30 NOTE — Patient Instructions (Signed)
Jonathan Bryant  01/30/2021     @PREFPERIOPPHARMACY @   Your procedure is scheduled on  02/06/2021.   Report to [facility] at  [phone]  A.M.  Call this number if you have problems the morning of surgery:  [phone]   Remember:  Follow the diet and prep instructions given to you by the office.    Take these medicines the morning of surgery with A SIP OF WATER      Lexapro.     Please brush your teeth.  Do not wear jewelry, make-up or nail polish.  Do not wear lotions, powders, or perfumes, or deodorant.  Do not shave 48 hours prior to surgery.  Men may shave face and neck.  Do not bring valuables to the hospital.  [facility] is not responsible for any belongings or valuables.  Contacts, dentures or bridgework may not be worn into surgery.  Leave your suitcase in the car.  After surgery it may be brought to your room.  For patients admitted to the hospital, discharge time will be determined by your treatment team.  Patients discharged the day of surgery will not be allowed to drive home and must have someone with them for 24 hours.    Special instructions:    DO NOT smoke tobacco or vape for 24 hours before your procedure.  Please read over the following fact sheets that you were given. Anesthesia Post-op Instructions and Care and Recovery After Surgery      Colonoscopy, Adult, Care After This sheet gives you information about how to care for yourself after your procedure. Your health care provider may also give you more specific instructions. If you have problems or questions, contact your health careprovider. What can I expect after the procedure? After the procedure, it is common to have: A small amount of blood in your stool for 24 hours after the procedure. Some gas. Mild cramping or bloating of your abdomen. Follow these instructions at home: Eating and drinking  Drink enough fluid to keep your urine pale yellow. Follow instructions from your health  care provider about eating or drinking restrictions. Resume your normal diet as instructed by your health care provider. Avoid heavy or fried foods that are hard to digest.  Activity Rest as told by your health care provider. Avoid sitting for a long time without moving. Get up to take short walks every 1-2 hours. This is important to improve blood flow and breathing. Ask for help if you feel weak or unsteady. Return to your normal activities as told by your health care provider. Ask your health care provider what activities are safe for you. Managing cramping and bloating  Try walking around when you have cramps or feel bloated. Apply heat to your abdomen as told by your health care provider. Use the heat source that your health care provider recommends, such as a moist heat pack or a heating pad. Place a towel between your skin and the heat source. Leave the heat on for 20-30 minutes. Remove the heat if your skin turns bright red. This is especially important if you are unable to feel pain, heat, or cold. You may have a greater risk of getting burned.  General instructions If you were given a sedative during the procedure, it can affect you for several hours. Do not drive or operate machinery until your health care provider says that it is safe. For the first 24 hours after the procedure: Do  not sign important documents. Do not drink alcohol. Do your regular daily activities at a slower pace than normal. Eat soft foods that are easy to digest. Take over-the-counter and prescription medicines only as told by your health care provider. Keep all follow-up visits as told by your health care provider. This is important. Contact a health care provider if: You have blood in your stool 2-3 days after the procedure. Get help right away if you have: More than a small spotting of blood in your stool. Large blood clots in your stool. Swelling of your abdomen. Nausea or vomiting. A  fever. Increasing pain in your abdomen that is not relieved with medicine. Summary After the procedure, it is common to have a small amount of blood in your stool. You may also have mild cramping and bloating of your abdomen. If you were given a sedative during the procedure, it can affect you for several hours. Do not drive or operate machinery until your health care provider says that it is safe. Get help right away if you have a lot of blood in your stool, nausea or vomiting, a fever, or increased pain in your abdomen. This information is not intended to replace advice given to you by your health care provider. Make sure you discuss any questions you have with your healthcare provider. Document Revised: 07/22/2019 Document Reviewed: 02/21/2019 Elsevier Patient Education  [place] After This sheet gives you information about how to care for yourself after your procedure. Your health care provider may also give you more specific instructions. If you have problems or questions, contact your health careprovider. What can I expect after the procedure? After the procedure, it is common to have: Tiredness. Forgetfulness about what happened after the procedure. Impaired judgment for important decisions. Nausea or vomiting. Some difficulty with balance. Follow these instructions at home: For the time period you were told by your health care provider:     Rest as needed. Do not participate in activities where you could fall or become injured. Do not drive or use machinery. Do not drink alcohol. Do not take sleeping pills or medicines that cause drowsiness. Do not make important decisions or sign legal documents. Do not take care of children on your own. Eating and drinking Follow the diet that is recommended by your health care provider. Drink enough fluid to keep your urine pale yellow. If you vomit: Drink water, juice, or soup when you can drink  without vomiting. Make sure you have little or no nausea before eating solid foods. General instructions Have a responsible adult stay with you for the time you are told. It is important to have someone help care for you until you are awake and alert. Take over-the-counter and prescription medicines only as told by your health care provider. If you have sleep apnea, surgery and certain medicines can increase your risk for breathing problems. Follow instructions from your health care provider about wearing your sleep device: Anytime you are sleeping, including during daytime naps. While taking prescription pain medicines, sleeping medicines, or medicines that make you drowsy. Avoid smoking. Keep all follow-up visits as told by your health care provider. This is important. Contact a health care provider if: You keep feeling nauseous or you keep vomiting. You feel light-headed. You are still sleepy or having trouble with balance after 24 hours. You develop a rash. You have a fever. You have redness or swelling around the IV site. Get help right away if:  You have trouble breathing. You have new-onset confusion at home. Summary For several hours after your procedure, you may feel tired. You may also be forgetful and have poor judgment. Have a responsible adult stay with you for the time you are told. It is important to have someone help care for you until you are awake and alert. Rest as told. Do not drive or operate machinery. Do not drink alcohol or take sleeping pills. Get help right away if you have trouble breathing, or if you suddenly become confused. This information is not intended to replace advice given to you by your health care provider. Make sure you discuss any questions you have with your healthcare provider. Document Revised: 04/12/2020 Document Reviewed: 06/30/2019 Elsevier Patient Education  2022 [employer].

## 2021-02-04 ENCOUNTER — Other Ambulatory Visit (HOSPITAL_COMMUNITY): Admission: RE | Admit: 2021-02-04 | Payer: Medicare HMO | Source: Ambulatory Visit

## 2021-02-04 ENCOUNTER — Encounter (HOSPITAL_COMMUNITY)
Admission: RE | Admit: 2021-02-04 | Discharge: 2021-02-04 | Disposition: A | Discharge status: Home / Self Care | Payer: Medicare HMO | Source: Ambulatory Visit | Attending: Internal Medicine | Admitting: Internal Medicine

## 2021-02-04 ENCOUNTER — Other Ambulatory Visit: Payer: Self-pay

## 2021-02-04 ENCOUNTER — Encounter (HOSPITAL_COMMUNITY): Payer: Self-pay

## 2021-02-04 HISTORY — DX: Anxiety disorder, unspecified: F41.9

## 2021-02-04 HISTORY — DX: Depression, unspecified: F32.A

## 2021-02-06 ENCOUNTER — Encounter (HOSPITAL_COMMUNITY)
Admission: RE | Disposition: A | Discharge status: Home / Self Care | Payer: Self-pay | Source: Home / Self Care | Attending: Internal Medicine

## 2021-02-06 ENCOUNTER — Encounter (INDEPENDENT_AMBULATORY_CARE_PROVIDER_SITE_OTHER): Payer: Self-pay | Admitting: *Deleted

## 2021-02-06 ENCOUNTER — Ambulatory Visit (HOSPITAL_COMMUNITY)
Admission: RE | Admit: 2021-02-06 | Discharge: 2021-02-06 | Disposition: A | Discharge status: Home / Self Care | Payer: Medicare HMO | Attending: Internal Medicine | Admitting: Internal Medicine

## 2021-02-06 ENCOUNTER — Other Ambulatory Visit: Payer: Self-pay

## 2021-02-06 ENCOUNTER — Encounter (HOSPITAL_COMMUNITY): Payer: Self-pay | Admitting: Internal Medicine

## 2021-02-06 ENCOUNTER — Ambulatory Visit (HOSPITAL_COMMUNITY): Payer: Medicare HMO | Admitting: Certified Registered"

## 2021-02-06 DIAGNOSIS — Z87891 Personal history of nicotine dependence: Secondary | ICD-10-CM | POA: Insufficient documentation

## 2021-02-06 DIAGNOSIS — R69 Illness, unspecified: Secondary | ICD-10-CM | POA: Diagnosis not present

## 2021-02-06 DIAGNOSIS — K644 Residual hemorrhoidal skin tags: Secondary | ICD-10-CM | POA: Diagnosis not present

## 2021-02-06 DIAGNOSIS — K648 Other hemorrhoids: Secondary | ICD-10-CM | POA: Diagnosis not present

## 2021-02-06 DIAGNOSIS — K5730 Diverticulosis of large intestine without perforation or abscess without bleeding: Secondary | ICD-10-CM | POA: Diagnosis not present

## 2021-02-06 DIAGNOSIS — Z1211 Encounter for screening for malignant neoplasm of colon: Secondary | ICD-10-CM

## 2021-02-06 DIAGNOSIS — Z79899 Other long term (current) drug therapy: Secondary | ICD-10-CM | POA: Insufficient documentation

## 2021-02-06 HISTORY — PX: COLONOSCOPY WITH PROPOFOL: SHX5780

## 2021-02-06 LAB — HM COLONOSCOPY

## 2021-02-06 SURGERY — COLONOSCOPY WITH PROPOFOL
Anesthesia: General

## 2021-02-06 MED ORDER — LACTATED RINGERS IV SOLN: INTRAVENOUS | Status: DC

## 2021-02-06 MED ORDER — PROPOFOL 500 MG/50ML IV EMUL
INTRAVENOUS | Status: DC | PRN
  Administered 2021-02-06: 150 ug/kg/min via INTRAVENOUS

## 2021-02-06 MED ORDER — PROPOFOL 10 MG/ML IV BOLUS
INTRAVENOUS | Status: DC | PRN
  Administered 2021-02-06: 100 mg via INTRAVENOUS

## 2021-02-06 MED ORDER — LIDOCAINE HCL (CARDIAC) PF 100 MG/5ML IV SOSY
PREFILLED_SYRINGE | INTRAVENOUS | Status: DC | PRN
  Administered 2021-02-06: 50 mg via INTRAVENOUS

## 2021-02-06 NOTE — H&P (Signed)
Jonathan Bryant is an 75 y.o. male.   Chief Complaint: Patient is here for colonoscopy. HPI: Patient is 75 year old Caucasian male who is here for screening colonoscopy.  Last exam was normal 10 years ago.  He denies abdominal pain change in bowel habits or rectal bleeding.  His appetite is good and his weight has been stable.  He says he walks 2 miles every day.  He is still working some.  He does not take aspirin or anticoagulants. Family history is negative for CRC.  Past Medical History:  Diagnosis Date   Anxiety    Depression    Impaired fasting glucose     Past Surgical History:  Procedure Laterality Date   back surgery x 2     in the early 90s,. Neck surgery in 2000.   HERNIA REPAIR     Rt inguinal surgery     late 90s    History reviewed. No pertinent family history. Social History:  reports that he has quit smoking. He has never used smokeless tobacco. He reports current alcohol use. He reports that he does not use drugs.  Allergies: No Known Allergies  Medications Prior to Admission  Medication Sig Dispense Refill   ALPRAZolam (XANAX) 1 MG tablet Take 1 mg by mouth at bedtime.     diphenhydrAMINE (BENADRYL) 25 MG tablet Take 25 mg by mouth every 6 (six) hours as needed for allergies.     escitalopram (LEXAPRO) 20 MG tablet Take 1 tablet (20 mg total) by mouth daily. 30 tablet 1   ALPRAZolam (XANAX) 0.5 MG tablet Take 1 tab daily prn anxiety. (Patient not taking: No sig reported) 30 tablet 0    No results found for this or any previous visit (from the past 48 hour(s)). No results found.  Review of Systems  Blood pressure 116/63, pulse 75, temperature 98.4 F (36.9 C), temperature source Oral, resp. rate 14, SpO2 99 %. Physical Exam HENT:     Mouth/Throat:     Mouth: Mucous membranes are moist.     Pharynx: Oropharynx is clear.  Eyes:     General: No scleral icterus.    Conjunctiva/sclera: Conjunctivae normal.  Cardiovascular:     Rate and Rhythm: Normal  rate and regular rhythm.     Heart sounds: Normal heart sounds. No murmur heard. Pulmonary:     Effort: Pulmonary effort is normal.     Breath sounds: Normal breath sounds.  Abdominal:     Comments: Abdomen is flat.  Scar in right inguinal region.  Abdomen is soft and nontender with organomegaly or masses.  Musculoskeletal:        General: No swelling.     Cervical back: Neck supple.  Lymphadenopathy:     Cervical: No cervical adenopathy.  Skin:    General: Skin is warm and dry.  Neurological:     Mental Status: He is alert.     Assessment/Plan  Average risk screening colonoscopy  Hildred Laser, MD 02/06/2021, 8:09 AM

## 2021-02-06 NOTE — Discharge Instructions (Signed)
Resume usual medications as before. High-fiber diet. No driving for 24 hours. No more screening colonoscopies.

## 2021-02-06 NOTE — Anesthesia Preprocedure Evaluation (Signed)
Anesthesia Evaluation  Patient identified by MRN, date of birth, ID band Patient awake    Reviewed: Allergy & Precautions, H&P , NPO status , Patient's Chart, lab work & pertinent test results, reviewed documented beta blocker date and time   Airway Mallampati: II  TM Distance: >3 FB Neck ROM: full    Dental no notable dental hx.    Pulmonary neg pulmonary ROS, former smoker,    Pulmonary exam normal breath sounds clear to auscultation       Cardiovascular Exercise Tolerance: Good negative cardio ROS   Rhythm:regular Rate:Normal     Neuro/Psych PSYCHIATRIC DISORDERS Anxiety Depression negative neurological ROS     GI/Hepatic negative GI ROS, Neg liver ROS,   Endo/Other  negative endocrine ROS  Renal/GU negative Renal ROS  negative genitourinary   Musculoskeletal   Abdominal   Peds  Hematology negative hematology ROS (+)   Anesthesia Other Findings   Reproductive/Obstetrics negative OB ROS                             Anesthesia Physical Anesthesia Plan  ASA: 2  Anesthesia Plan: General   Post-op Pain Management:    Induction:   PONV Risk Score and Plan: Propofol infusion  Airway Management Planned:   Additional Equipment:   Intra-op Plan:   Post-operative Plan:   Informed Consent: I have reviewed the patients History and Physical, chart, labs and discussed the procedure including the risks, benefits and alternatives for the proposed anesthesia with the patient or authorized representative who has indicated his/her understanding and acceptance.     Dental Advisory Given  Plan Discussed with: CRNA  Anesthesia Plan Comments:         Anesthesia Quick Evaluation

## 2021-02-06 NOTE — Op Note (Signed)
[facility] Patient Name: Jonathan Bryant Procedure Date: 02/06/2021 7:58 AM MRN: [id number] Date of Birth: 11-Jan-1946 Attending MD: Hildred Laser , MD CSN: [id number] Age: 75 Admit Type: Outpatient Procedure:                Colonoscopy Indications:              Screening for colorectal malignant neoplasm Providers:                Hildred Laser, MD, Otis Peak B. Sharon Seller, RN, Casimer Bilis, Technician Referring MD:             Delphina Cahill, MD Medicines:                Propofol per Anesthesia Complications:            No immediate complications. Estimated Blood Loss:     Estimated blood loss: none. Procedure:                Pre-Anesthesia Assessment:                           - Prior to the procedure, a History and Physical                            was performed, and patient medications and                            allergies were reviewed. The patient's tolerance of                            previous anesthesia was also reviewed. The risks                            and benefits of the procedure and the sedation                            options and risks were discussed with the patient.                            All questions were answered, and informed consent                            was obtained. Prior Anticoagulants: The patient has                            taken no previous anticoagulant or antiplatelet                            agents. ASA Grade Assessment: II - A patient with                            mild systemic disease. After reviewing the risks  and benefits, the patient was deemed in                            satisfactory condition to undergo the procedure.                           After obtaining informed consent, the colonoscope                            was passed under direct vision. Throughout the                            procedure, the patient's blood pressure, pulse, and                             oxygen saturations were monitored continuously. The                            [id number]-[id number] ([id number]) scope was introduced through                            the anus and advanced to the the cecum, identified                            by appendiceal orifice and ileocecal valve. The                            colonoscopy was performed without difficulty. The                            patient tolerated the procedure well. The quality                            of the bowel preparation was excellent. The                            ileocecal valve, appendiceal orifice, and rectum                            were photographed. Scope In: 8:19:11 AM Scope Out: 8:29:50 AM Scope Withdrawal Time: 0 hours 5 minutes 54 seconds  Total Procedure Duration: 0 hours 10 minutes 39 seconds  Findings:      The perianal and digital rectal examinations were normal.      Multiple small and large-mouthed diverticula were found in the sigmoid       colon, descending colon and splenic flexure.      The exam was otherwise normal throughout the examined colon.      External hemorrhoids were found during retroflexion. The hemorrhoids       were medium-sized. Impression:               - Diverticulosis in the sigmoid colon, in the                            descending colon and at the splenic  flexure.                           - External hemorrhoids.                           - No specimens collected. Moderate Sedation:      Per Anesthesia Care Recommendation:           - Patient has a contact number available for                            emergencies. The signs and symptoms of potential                            delayed complications were discussed with the                            patient. Return to normal activities tomorrow.                            Written discharge instructions were provided to the                            patient.                           - High fiber diet today.                            - Continue present medications.                           - No repeat colonoscopy due to age and the absence                            of advanced adenomas. Procedure Code(s):        --- Professional ---                           [phone], Colonoscopy, flexible; diagnostic, including                            collection of specimen(s) by brushing or washing,                            when performed (separate procedure) Diagnosis Code(s):        --- Professional ---                           Z12.11, Encounter for screening for malignant                            neoplasm of colon                           K64.4, Residual hemorrhoidal skin tags  K57.30, Diverticulosis of large intestine without                            perforation or abscess without bleeding CPT copyright 2019 American Medical Association. All rights reserved. The codes documented in this report are preliminary and upon coder review may  be revised to meet current compliance requirements. Hildred Laser, MD Hildred Laser, MD 02/06/2021 8:35:53 AM This report has been signed electronically. Number of Addenda: 0

## 2021-02-06 NOTE — Anesthesia Procedure Notes (Signed)
Date/Time: 02/06/2021 8:21 AM Performed by: Orlie Dakin, CRNA Pre-anesthesia Checklist: Patient identified, Emergency Drugs available, Suction available and Patient being monitored Patient Re-evaluated:Patient Re-evaluated prior to induction Oxygen Delivery Method: Nasal cannula Induction Type: IV induction Placement Confirmation: positive ETCO2

## 2021-02-06 NOTE — Anesthesia Postprocedure Evaluation (Signed)
Anesthesia Post Note  Patient: Jonathan Bryant  Procedure(s) Performed: COLONOSCOPY WITH PROPOFOL  Patient location during evaluation: Phase II Anesthesia Type: General Level of consciousness: awake Pain management: pain level controlled Vital Signs Assessment: post-procedure vital signs reviewed and stable Respiratory status: spontaneous breathing and respiratory function stable Cardiovascular status: blood pressure returned to baseline and stable Postop Assessment: no headache and no apparent nausea or vomiting Anesthetic complications: no Comments: Late entry   No notable events documented.   Last Vitals:  Vitals:   02/06/21 0836 02/06/21 0840  BP: 95/63 104/68  Pulse:    Resp:    Temp:    SpO2:      Last Pain:  Vitals:   02/06/21 0834  TempSrc: Oral  PainSc: 0-No pain                 Louann Sjogren

## 2021-02-06 NOTE — Transfer of Care (Signed)
Immediate Anesthesia Transfer of Care Note  Patient: Jonathan Bryant  Procedure(s) Performed: COLONOSCOPY WITH PROPOFOL  Patient Location: Short Stay  Anesthesia Type:General  Level of Consciousness: awake, alert  and oriented  Airway & Oxygen Therapy: Patient Spontanous Breathing  Post-op Assessment: Report given to RN and Post -op Vital signs reviewed and stable  Post vital signs: Reviewed and stable  Last Vitals:  Vitals Value Taken Time  BP    Temp    Pulse    Resp    SpO2      Last Pain:  Vitals:   02/06/21 0815  TempSrc:   PainSc: 0-No pain         Complications: No notable events documented.

## 2021-02-12 DIAGNOSIS — E782 Mixed hyperlipidemia: Secondary | ICD-10-CM | POA: Diagnosis not present

## 2021-02-12 DIAGNOSIS — Z79899 Other long term (current) drug therapy: Secondary | ICD-10-CM | POA: Diagnosis not present

## 2021-02-12 DIAGNOSIS — Z125 Encounter for screening for malignant neoplasm of prostate: Secondary | ICD-10-CM | POA: Diagnosis not present

## 2021-02-13 ENCOUNTER — Encounter (HOSPITAL_COMMUNITY): Payer: Self-pay | Admitting: Internal Medicine

## 2021-02-22 DIAGNOSIS — D696 Thrombocytopenia, unspecified: Secondary | ICD-10-CM | POA: Diagnosis not present

## 2021-02-22 DIAGNOSIS — R69 Illness, unspecified: Secondary | ICD-10-CM | POA: Diagnosis not present

## 2021-02-22 DIAGNOSIS — E782 Mixed hyperlipidemia: Secondary | ICD-10-CM | POA: Diagnosis not present

## 2021-02-22 DIAGNOSIS — G4700 Insomnia, unspecified: Secondary | ICD-10-CM | POA: Diagnosis not present

## 2021-04-24 ENCOUNTER — Telehealth: Payer: Self-pay | Admitting: Family Medicine

## 2021-04-24 NOTE — Telephone Encounter (Signed)
No answer unable to leave a message for patient to call back and schedule Medicare Annual Wellness Visit (AWV) in office.   If unable to come into the office for AWV,  please offer to do virtually or by telephone.  Last AWV: 03/05/2020  Please schedule at anytime with [facility] Advisor.  40 minute appointment  Any questions, please contact me at [phone]

## 2021-05-23 DIAGNOSIS — E782 Mixed hyperlipidemia: Secondary | ICD-10-CM | POA: Diagnosis not present

## 2021-05-27 DIAGNOSIS — R69 Illness, unspecified: Secondary | ICD-10-CM | POA: Diagnosis not present

## 2021-05-27 DIAGNOSIS — E782 Mixed hyperlipidemia: Secondary | ICD-10-CM | POA: Diagnosis not present

## 2021-05-27 DIAGNOSIS — G4700 Insomnia, unspecified: Secondary | ICD-10-CM | POA: Diagnosis not present

## 2021-05-27 DIAGNOSIS — D696 Thrombocytopenia, unspecified: Secondary | ICD-10-CM | POA: Diagnosis not present

## 2021-05-27 DIAGNOSIS — Z0001 Encounter for general adult medical examination with abnormal findings: Secondary | ICD-10-CM | POA: Diagnosis not present

## 2021-08-29 DIAGNOSIS — E782 Mixed hyperlipidemia: Secondary | ICD-10-CM | POA: Diagnosis not present

## 2021-09-02 DIAGNOSIS — E782 Mixed hyperlipidemia: Secondary | ICD-10-CM | POA: Diagnosis not present

## 2021-09-02 DIAGNOSIS — G4700 Insomnia, unspecified: Secondary | ICD-10-CM | POA: Diagnosis not present

## 2021-09-02 DIAGNOSIS — R69 Illness, unspecified: Secondary | ICD-10-CM | POA: Diagnosis not present

## 2021-09-02 DIAGNOSIS — D696 Thrombocytopenia, unspecified: Secondary | ICD-10-CM | POA: Diagnosis not present

## 2021-11-19 DIAGNOSIS — Z0100 Encounter for examination of eyes and vision without abnormal findings: Secondary | ICD-10-CM | POA: Diagnosis not present

## 2021-11-19 DIAGNOSIS — H5200 Hypermetropia, unspecified eye: Secondary | ICD-10-CM | POA: Diagnosis not present

## 2022-02-13 DIAGNOSIS — E782 Mixed hyperlipidemia: Secondary | ICD-10-CM | POA: Diagnosis not present

## 2022-02-17 DIAGNOSIS — E782 Mixed hyperlipidemia: Secondary | ICD-10-CM | POA: Diagnosis not present

## 2022-02-17 DIAGNOSIS — D696 Thrombocytopenia, unspecified: Secondary | ICD-10-CM | POA: Diagnosis not present

## 2022-02-17 DIAGNOSIS — R42 Dizziness and giddiness: Secondary | ICD-10-CM | POA: Diagnosis not present

## 2022-02-17 DIAGNOSIS — R69 Illness, unspecified: Secondary | ICD-10-CM | POA: Diagnosis not present

## 2022-02-17 DIAGNOSIS — B370 Candidal stomatitis: Secondary | ICD-10-CM | POA: Diagnosis not present

## 2022-02-20 DIAGNOSIS — L821 Other seborrheic keratosis: Secondary | ICD-10-CM | POA: Diagnosis not present

## 2022-02-20 DIAGNOSIS — L820 Inflamed seborrheic keratosis: Secondary | ICD-10-CM | POA: Diagnosis not present

## 2022-02-20 DIAGNOSIS — L923 Foreign body granuloma of the skin and subcutaneous tissue: Secondary | ICD-10-CM | POA: Diagnosis not present

## 2022-05-16 DIAGNOSIS — Z0000 Encounter for general adult medical examination without abnormal findings: Secondary | ICD-10-CM | POA: Diagnosis not present

## 2022-07-15 DIAGNOSIS — R682 Dry mouth, unspecified: Secondary | ICD-10-CM | POA: Diagnosis not present

## 2022-07-15 DIAGNOSIS — R69 Illness, unspecified: Secondary | ICD-10-CM | POA: Diagnosis not present

## 2022-08-14 DIAGNOSIS — R682 Dry mouth, unspecified: Secondary | ICD-10-CM | POA: Diagnosis not present

## 2022-08-14 DIAGNOSIS — E782 Mixed hyperlipidemia: Secondary | ICD-10-CM | POA: Diagnosis not present

## 2022-08-14 DIAGNOSIS — Z139 Encounter for screening, unspecified: Secondary | ICD-10-CM | POA: Diagnosis not present

## 2022-08-20 DIAGNOSIS — Z0001 Encounter for general adult medical examination with abnormal findings: Secondary | ICD-10-CM | POA: Diagnosis not present

## 2022-08-20 DIAGNOSIS — R69 Illness, unspecified: Secondary | ICD-10-CM | POA: Diagnosis not present

## 2022-08-20 DIAGNOSIS — R42 Dizziness and giddiness: Secondary | ICD-10-CM | POA: Diagnosis not present

## 2022-08-20 DIAGNOSIS — Z0000 Encounter for general adult medical examination without abnormal findings: Secondary | ICD-10-CM | POA: Diagnosis not present

## 2022-08-20 DIAGNOSIS — D696 Thrombocytopenia, unspecified: Secondary | ICD-10-CM | POA: Diagnosis not present

## 2022-08-20 DIAGNOSIS — E782 Mixed hyperlipidemia: Secondary | ICD-10-CM | POA: Diagnosis not present

## 2022-08-20 DIAGNOSIS — R7303 Prediabetes: Secondary | ICD-10-CM | POA: Diagnosis not present

## 2022-10-08 ENCOUNTER — Encounter: Payer: Self-pay | Admitting: Internal Medicine

## 2022-10-08 ENCOUNTER — Ambulatory Visit: Payer: Medicare HMO | Attending: Internal Medicine | Admitting: Internal Medicine

## 2022-10-08 VITALS — BP 122/84 | HR 83 | Ht 67.0 in | Wt 132.8 lb

## 2022-10-08 DIAGNOSIS — R29898 Other symptoms and signs involving the musculoskeletal system: Secondary | ICD-10-CM

## 2022-10-08 DIAGNOSIS — R42 Dizziness and giddiness: Secondary | ICD-10-CM | POA: Diagnosis not present

## 2022-10-08 DIAGNOSIS — Z136 Encounter for screening for cardiovascular disorders: Secondary | ICD-10-CM

## 2022-10-08 NOTE — Progress Notes (Signed)
Cardiology Office Note  Date: 10/08/2022   ID: Aristeo, Granade Mar 13, 1946, MRN [id number]  PCP:  Celene Squibb, MD  Cardiologist:  None Electrophysiologist:  None   Reason for Office Visit: Evaluation of dizziness at the request of Mare Ferrari, FNP   History of Present Illness: Jonathan Bryant is a 77 y.o. male known to have anxiety/depression was referred to cardiology clinic for evaluation of dizziness.  Patient has sporadic episodes of dizziness lasting for 4 to 5 minutes where his head becomes wobbly and has to sit down for the dizziness to resolve completely. Frequency has been once in a few months. Denied any syncope or palpitations. Denies any chest discomfort/pressure/heaviness/tightness at rest or exertion. He does complain of arm heaviness bilaterally with no chest pain. Denies DOE he or leg swelling. Denies smoking cigarettes. He is physically active at baseline, cuts grass/wood, walks 2 miles per day, goes to gym, exercises every day. He did not have any symptoms of dizziness during exertion.  Past Medical History:  Diagnosis Date   Anxiety    Depression    Impaired fasting glucose     Past Surgical History:  Procedure Laterality Date   back surgery x 2     in the early 90s,. Neck surgery in 2000.   COLONOSCOPY WITH PROPOFOL N/A 02/06/2021   Procedure: COLONOSCOPY WITH PROPOFOL;  Surgeon: Rogene Houston, MD;  [facility]: [facility];  Service: Endoscopy;  Laterality: N/A;  820   HERNIA REPAIR     Rt inguinal surgery     late 90s    Current Outpatient Medications  Medication Sig Dispense Refill   ALPRAZolam (XANAX) 1 MG tablet Take 1 mg by mouth at bedtime.     diphenhydrAMINE (BENADRYL) 25 MG tablet Take 25 mg by mouth every 6 (six) hours as needed for allergies.     escitalopram (LEXAPRO) 20 MG tablet Take 1 tablet (20 mg total) by mouth daily. 30 tablet 1   No current facility-administered medications for this visit.   Allergies:  Patient has no known  allergies.   Social History: The patient  reports that he has quit smoking. He has never used smokeless tobacco. He reports that he does not currently use alcohol. He reports that he does not use drugs.   Family History: The patient's family history is not on file.   ROS:  Please see the history of present illness. Otherwise, complete review of systems is positive for none.  All other systems are reviewed and negative.   Physical Exam: VS:  BP 122/84   Pulse 83   Ht '5\' 7"'$  (1.702 m)   Wt 132 lb 12.8 oz (60.2 kg)   SpO2 95%   BMI 20.80 kg/m , BMI Body mass index is 20.8 kg/m.  Wt Readings from Last 3 Encounters:  10/08/22 132 lb 12.8 oz (60.2 kg)  02/04/21 124 lb (56.2 kg)  04/09/20 132 lb 3.2 oz (60 kg)    General: Patient appears comfortable at rest. HEENT: Conjunctiva and lids normal, oropharynx clear with moist mucosa. Neck: Supple, no elevated JVP or carotid bruits, no thyromegaly. Lungs: Clear to auscultation, nonlabored breathing at rest. Cardiac: Regular rate and rhythm, no S3 or significant systolic murmur, no pericardial rub. Abdomen: Soft, nontender, no hepatomegaly, bowel sounds present, no guarding or rebound. Extremities: No pitting edema, distal pulses 2+. Skin: Warm and dry. Musculoskeletal: No kyphosis. Neuropsychiatric: Alert and oriented x3, affect grossly appropriate.  ECG: Normal sinus rhythm, RBBB  Recent  Labwork: No results found for requested labs within last 365 days.     Component Value Date/Time   CHOL 212 (H) 02/27/2020 0818   TRIG 82 02/27/2020 0818   HDL 54 02/27/2020 0818   CHOLHDL 3.9 02/27/2020 0818   CHOLHDL 3.8 10/12/2013 0806   VLDL 21 10/12/2013 0806   LDLCALC 143 (H) 02/27/2020 0818    Other Studies Reviewed Today:   Assessment and Plan: Patient is a 77 year old M with no PMH was referred to cardiology clinic for evaluation of dizziness.  # Dizziness -Patient has dizziness at rest and not with exertion. Episodes have been  sporadic, happening once in few months and lasting 4 to 5 minutes. Denied any syncope or palpitations. I do not think dizziness is cardiac in etiology.  No further cardiac testing is indicated at this time. However if his dizziness becomes more frequent in the future, he will need cardiac testing. He voiced understanding.  # Bilateral arm heaviness -Patient has bilateral arm heaviness with no chest pain. Noncardiac in nature. No cardiac testing is indicated.  # Screening for CAD -Educated the patient about CT calcium coronaries.  He said he will think about it.  I have spent a total of 30 minutes with patient reviewing chart, EKGs, labs and examining patient as well as establishing an assessment and plan that was discussed with the patient.  > 50% of time was spent in direct patient care.     Medication Adjustments/Labs and Tests Ordered: Current medicines are reviewed at length with the patient today.  Concerns regarding medicines are outlined above.   Tests Ordered: Orders Placed This Encounter  Procedures   EKG 12-Lead    Medication Changes: No orders of the defined types were placed in this encounter.   Disposition:  Follow up  2 years  Signed, Uriah Trueba Fidel Levy, MD, 10/08/2022 10:42 AM    [facility] [address] [place], [place] [address]

## 2022-10-08 NOTE — Patient Instructions (Signed)
Medication Instructions:  Your physician recommends that you continue on your current medications as directed. Please refer to the Current Medication list given to you today.   Labwork: None  Testing/Procedures: None  Follow-Up: Follow up with Dr. Dellia Cloud in 2 years.   Any Other Special Instructions Will Be Listed Below (If Applicable).     If you need a refill on your cardiac medications before your next appointment, please call your pharmacy.

## 2022-10-16 DIAGNOSIS — R1010 Upper abdominal pain, unspecified: Secondary | ICD-10-CM | POA: Diagnosis not present

## 2023-02-17 DIAGNOSIS — R7303 Prediabetes: Secondary | ICD-10-CM | POA: Diagnosis not present

## 2023-02-17 DIAGNOSIS — Z139 Encounter for screening, unspecified: Secondary | ICD-10-CM | POA: Diagnosis not present

## 2023-02-23 DIAGNOSIS — K117 Disturbances of salivary secretion: Secondary | ICD-10-CM | POA: Diagnosis not present

## 2023-02-23 DIAGNOSIS — F411 Generalized anxiety disorder: Secondary | ICD-10-CM | POA: Diagnosis not present

## 2023-02-23 DIAGNOSIS — D696 Thrombocytopenia, unspecified: Secondary | ICD-10-CM | POA: Diagnosis not present

## 2023-02-23 DIAGNOSIS — E782 Mixed hyperlipidemia: Secondary | ICD-10-CM | POA: Diagnosis not present

## 2023-02-23 DIAGNOSIS — R7303 Prediabetes: Secondary | ICD-10-CM | POA: Diagnosis not present

## 2023-02-23 DIAGNOSIS — Z79899 Other long term (current) drug therapy: Secondary | ICD-10-CM | POA: Diagnosis not present

## 2023-02-23 DIAGNOSIS — Z0000 Encounter for general adult medical examination without abnormal findings: Secondary | ICD-10-CM | POA: Diagnosis not present

## 2023-03-12 DIAGNOSIS — H00015 Hordeolum externum left lower eyelid: Secondary | ICD-10-CM | POA: Diagnosis not present

## 2023-03-15 ENCOUNTER — Ambulatory Visit
Admission: EM | Admit: 2023-03-15 | Discharge: 2023-03-15 | Disposition: A | Discharge status: Home / Self Care | Payer: Medicare HMO | Attending: Nurse Practitioner | Admitting: Nurse Practitioner

## 2023-03-15 ENCOUNTER — Encounter: Payer: Self-pay | Admitting: Emergency Medicine

## 2023-03-15 DIAGNOSIS — U071 COVID-19: Secondary | ICD-10-CM | POA: Diagnosis not present

## 2023-03-15 MED ORDER — ONDANSETRON 4 MG PO TBDP: 4.0000 mg | ORAL_TABLET | Freq: Three times a day (TID) | ORAL | 0 refills | Status: DC | PRN

## 2023-03-15 MED ORDER — MOLNUPIRAVIR EUA 200MG CAPSULE: 4.0000 | ORAL_CAPSULE | Freq: Two times a day (BID) | ORAL | 0 refills | Status: DC

## 2023-03-15 MED ORDER — FLUTICASONE PROPIONATE 50 MCG/ACT NA SUSP: 2.0000 | Freq: Every day | NASAL | 0 refills | Status: DC

## 2023-03-15 MED ORDER — PAXLOVID (300/100) 20 X 150 MG & 10 X 100MG PO TBPK: 3.0000 | ORAL_TABLET | Freq: Two times a day (BID) | ORAL | 0 refills | Status: AC

## 2023-03-15 NOTE — ED Triage Notes (Signed)
Runny nose, sore throat, headache and vomiting since last night.  Took a home covid test last night and it was positive.  States wife was diagnosed with covid last Sunday.

## 2023-03-15 NOTE — ED Provider Notes (Signed)
[facility]    CSN: [id number] Arrival date & time: 03/15/23  0800      History   Chief Complaint No chief complaint on file.   HPI Jonathan Bryant is a 77 y.o. male.   The history is provided by the patient.   The patient presents for complaints of fatigue, headache, sore throat, runny nose, and vomiting.  Patient states symptoms started last evening.  Patient states he took a home COVID test at that time which was positive.  He denies fever, chills, ear pain, ear drainage, cough, chest pain, abdominal pain, diarrhea, or constipation.  Patient reports that his wife tested positive for COVID 1 week ago.  He reports he has been taking Tylenol for his symptoms.  Past Medical History:  Diagnosis Date   Anxiety    Depression    Impaired fasting glucose     Patient Active Problem List   Diagnosis Date Noted   Dizziness 10/08/2022   Arm heaviness 10/08/2022   Screening for cardiovascular condition 10/08/2022   Erectile dysfunction 12/18/2016   Insomnia 09/25/2013   Depression 09/25/2013   Generalized anxiety disorder 09/25/2013   Dysphagia, unspecified(787.20) 09/14/2013    Past Surgical History:  Procedure Laterality Date   back surgery x 2     in the early 90s,. Neck surgery in 2000.   COLONOSCOPY WITH PROPOFOL N/A 02/06/2021   Procedure: COLONOSCOPY WITH PROPOFOL;  Surgeon: Malissa Hippo, MD;  Location: [facility];  Service: Endoscopy;  Laterality: N/A;  820   HERNIA REPAIR     Rt inguinal surgery     late 90s       Home Medications    Prior to Admission medications   Medication Sig Start Date End Date Taking? Authorizing Provider  fluticasone (FLONASE) 50 MCG/ACT nasal spray Place 2 sprays into both nostrils daily. 03/15/23  Yes Azaryah Oleksy-Warren, Sadie Haber, NP  nirmatrelvir & ritonavir (PAXLOVID, 300/100,) 20 x 150 MG & 10 x 100MG  TBPK Take 3 tablets by mouth 2 (two) times daily for 5 days. 03/15/23 03/20/23 Yes Quantae Martel-Warren, Sadie Haber, NP   ondansetron (ZOFRAN-ODT) 4 MG disintegrating tablet Take 1 tablet (4 mg total) by mouth every 8 (eight) hours as needed. 03/15/23  Yes Kendall Justo-Warren, Sadie Haber, NP  ALPRAZolam Prudy Feeler) 1 MG tablet Take 1 mg by mouth at bedtime. 01/24/21   [provider]  diphenhydrAMINE (BENADRYL) 25 MG tablet Take 25 mg by mouth every 6 (six) hours as needed for allergies.    [provider]  escitalopram (LEXAPRO) 20 MG tablet Take 1 tablet (20 mg total) by mouth daily. 04/09/20   Annalee Genta, DO    Family History History reviewed. No pertinent family history.  Social History Social History   Tobacco Use   Smoking status: Former   Smokeless tobacco: Never   Tobacco comments:    quit 35 yrs ago.  Vaping Use   Vaping status: Never Used  Substance Use Topics   Alcohol use: Not Currently    Comment: ocasional beer   Drug use: No     Allergies   Patient has no known allergies.   Review of Systems Review of Systems Per HPI  Physical Exam Triage Vital Signs ED Triage Vitals  Encounter Vitals Group     BP 03/15/23 0805 125/67     Systolic BP Percentile --      Diastolic BP Percentile --      Pulse Rate 03/15/23 0805 75  Resp 03/15/23 0805 16     Temp 03/15/23 0805 99.2 F (37.3 C)     Temp Source 03/15/23 0805 Oral     SpO2 03/15/23 0805 97 %     Weight --      Height --      Head Circumference --      Peak Flow --      Pain Score 03/15/23 0806 8     Pain Loc --      Pain Education --      Exclude from Growth Chart --    No data found.  Updated Vital Signs BP 125/67 (BP Location: Right Arm)   Pulse 75   Temp 99.2 F (37.3 C) (Oral)   Resp 16   SpO2 97%   Visual Acuity Right Eye Distance:   Left Eye Distance:   Bilateral Distance:    Right Eye Near:   Left Eye Near:    Bilateral Near:     Physical Exam Vitals and nursing note reviewed.  Constitutional:      General: He is not in acute distress.    Appearance: Normal appearance.  HENT:      Head: Normocephalic.     Right Ear: Tympanic membrane, ear canal and external ear normal.     Left Ear: Tympanic membrane, ear canal and external ear normal.     Nose: Congestion present.     Mouth/Throat:     Lips: Pink.     Mouth: Mucous membranes are moist.     Pharynx: Oropharynx is clear. Uvula midline. Posterior oropharyngeal erythema and postnasal drip present. No pharyngeal swelling, oropharyngeal exudate or uvula swelling.     Comments: Cobblestoning present to posterior oropharynx Eyes:     Extraocular Movements: Extraocular movements intact.     Conjunctiva/sclera: Conjunctivae normal.     Pupils: Pupils are equal, round, and reactive to light.  Cardiovascular:     Rate and Rhythm: Normal rate and regular rhythm.     Pulses: Normal pulses.     Heart sounds: Normal heart sounds.  Pulmonary:     Effort: Pulmonary effort is normal. No respiratory distress.     Breath sounds: Normal breath sounds. No stridor. No wheezing, rhonchi or rales.  Abdominal:     General: Bowel sounds are normal.     Palpations: Abdomen is soft.     Tenderness: There is no abdominal tenderness.  Musculoskeletal:     Cervical back: Normal range of motion.  Lymphadenopathy:     Cervical: No cervical adenopathy.  Skin:    General: Skin is warm and dry.  Neurological:     General: No focal deficit present.     Mental Status: He is alert and oriented to person, place, and time.  Psychiatric:        Mood and Affect: Mood normal.        Behavior: Behavior normal.      UC Treatments / Results  Labs (all labs ordered are listed, but only abnormal results are displayed) Labs Reviewed - No data to display  EKG   Radiology No results found.  Procedures Procedures (including critical care time)  Medications Ordered in UC Medications - No data to display  Initial Impression / Assessment and Plan / UC Course  I have reviewed the triage vital signs and the nursing notes.  Pertinent labs &  imaging results that were available during my care of the patient were reviewed by me and considered in my medical  decision making (see chart for details).  The patient is well-appearing, he is in no acute distress, vital signs are stable.  Patient with positive self-administered home COVID test.  Will start patient on Paxlovid (most recent cr 0.80, gfr-93 dated 08/14/2022). For nasal congestion, fluticasone nasal spray and Zofran 4 mg ODT for nausea.  Supportive care recommendations were provided and discussed with the patient to include continuing Tylenol for pain, fever, general discomfort, increasing fluids, and a brat diet until nausea and vomiting improved.  Patient was advised that if he develops worsening symptoms to include fever, cough, shortness of breath, difficulty breathing, nausea and vomiting that do not improve, recommend that he follow-up in the emergency department for further evaluation.  Patient was also advised to notify his primary care physician of his positive COVID test.  Patient is in agreement with this plan of care and verbalizes understanding.  All questions were answered.  Patient stable for discharge.   Final Clinical Impressions(s) / UC Diagnoses   Final diagnoses:  Positive self-administered antigen test for COVID-19     Discharge Instructions      Take medication as prescribed. Increase fluids and allow for plenty of rest.  As discussed, try to drink at least 8-10 8 ounce glasses of water daily while symptoms persist. Recommend over-the-counter ibuprofen or Tylenol as needed for pain, fever, general discomfort. Normal saline nasal spray throughout the day to help with nasal congestion and runny nose. If you develop a cough,, recommend using a humidifier in your bedroom at nighttime during sleep and sleeping elevated on pillows while cough symptoms persist. Recommend a brat diet until your nausea and vomiting improved.  This includes bananas, rice,  applesauce, and toast. As discussed, continue to wear your mask while you are experiencing symptoms and until you complete the medication.  If you continue to experience symptoms after completing the medication, wear your mask for an additional 5 days.  You will need to remain home if you develop a fever.  Do not return to normal activities around others until you have been fever free for 24 hours with no medication. Please notify your doctor of your positive COVID test. Go to the emergency department immediately if you experience a high fever greater than 102, worsening cough, shortness of breath, difficulty breathing, or other concerns. Follow-up as needed.      ED Prescriptions     Medication Sig Dispense Auth. Provider   molnupiravir EUA (LAGEVRIO) 200 mg CAPS capsule  (Status: Discontinued) Take 4 capsules (800 mg total) by mouth 2 (two) times daily for 5 days. 40 capsule Kindrick Lankford-Warren, Sadie Haber, NP   fluticasone (FLONASE) 50 MCG/ACT nasal spray Place 2 sprays into both nostrils daily. 16 g Lucynda Rosano-Warren, Sadie Haber, NP   ondansetron (ZOFRAN-ODT) 4 MG disintegrating tablet Take 1 tablet (4 mg total) by mouth every 8 (eight) hours as needed. 20 tablet Loriana Samad-Warren, Sadie Haber, NP   nirmatrelvir & ritonavir (PAXLOVID, 300/100,) 20 x 150 MG & 10 x 100MG  TBPK Take 3 tablets by mouth 2 (two) times daily for 5 days. 30 tablet Trebor Galdamez-Warren, Sadie Haber, NP      PDMP not reviewed this encounter.   Abran Cantor, NP 03/15/23 [phone]

## 2023-03-15 NOTE — Discharge Instructions (Signed)
Take medication as prescribed. Increase fluids and allow for plenty of rest.  As discussed, try to drink at least 8-10 8 ounce glasses of water daily while symptoms persist. Recommend over-the-counter ibuprofen or Tylenol as needed for pain, fever, general discomfort. Normal saline nasal spray throughout the day to help with nasal congestion and runny nose. If you develop a cough,, recommend using a humidifier in your bedroom at nighttime during sleep and sleeping elevated on pillows while cough symptoms persist. Recommend a brat diet until your nausea and vomiting improved.  This includes bananas, rice, applesauce, and toast. As discussed, continue to wear your mask while you are experiencing symptoms and until you complete the medication.  If you continue to experience symptoms after completing the medication, wear your mask for an additional 5 days.  You will need to remain home if you develop a fever.  Do not return to normal activities around others until you have been fever free for 24 hours with no medication. Please notify your doctor of your positive COVID test. Go to the emergency department immediately if you experience a high fever greater than 102, worsening cough, shortness of breath, difficulty breathing, or other concerns. Follow-up as needed.

## 2023-06-25 DIAGNOSIS — D2222 Melanocytic nevi of left ear and external auricular canal: Secondary | ICD-10-CM | POA: Diagnosis not present

## 2023-06-25 DIAGNOSIS — D224 Melanocytic nevi of scalp and neck: Secondary | ICD-10-CM | POA: Diagnosis not present

## 2023-07-01 DIAGNOSIS — H5203 Hypermetropia, bilateral: Secondary | ICD-10-CM | POA: Diagnosis not present

## 2023-08-20 DIAGNOSIS — R7303 Prediabetes: Secondary | ICD-10-CM | POA: Diagnosis not present

## 2023-08-20 DIAGNOSIS — E782 Mixed hyperlipidemia: Secondary | ICD-10-CM | POA: Diagnosis not present

## 2023-08-26 DIAGNOSIS — R197 Diarrhea, unspecified: Secondary | ICD-10-CM | POA: Diagnosis not present

## 2023-08-26 DIAGNOSIS — F411 Generalized anxiety disorder: Secondary | ICD-10-CM | POA: Diagnosis not present

## 2023-08-26 DIAGNOSIS — K117 Disturbances of salivary secretion: Secondary | ICD-10-CM | POA: Diagnosis not present

## 2023-08-26 DIAGNOSIS — E782 Mixed hyperlipidemia: Secondary | ICD-10-CM | POA: Diagnosis not present

## 2023-08-26 DIAGNOSIS — R7303 Prediabetes: Secondary | ICD-10-CM | POA: Diagnosis not present

## 2023-08-26 DIAGNOSIS — Z79899 Other long term (current) drug therapy: Secondary | ICD-10-CM | POA: Diagnosis not present

## 2023-08-26 DIAGNOSIS — D696 Thrombocytopenia, unspecified: Secondary | ICD-10-CM | POA: Diagnosis not present

## 2023-09-30 DIAGNOSIS — H93012 Transient ischemic deafness, left ear: Secondary | ICD-10-CM | POA: Diagnosis not present

## 2023-09-30 DIAGNOSIS — Z1589 Genetic susceptibility to other disease: Secondary | ICD-10-CM | POA: Diagnosis not present

## 2023-09-30 DIAGNOSIS — Z822 Family history of deafness and hearing loss: Secondary | ICD-10-CM | POA: Diagnosis not present

## 2023-09-30 DIAGNOSIS — Z1371 Encounter for nonprocreative screening for genetic disease carrier status: Secondary | ICD-10-CM | POA: Diagnosis not present

## 2023-09-30 DIAGNOSIS — Z8481 Family history of carrier of genetic disease: Secondary | ICD-10-CM | POA: Diagnosis not present

## 2024-01-11 DIAGNOSIS — C44612 Basal cell carcinoma of skin of right upper limb, including shoulder: Secondary | ICD-10-CM | POA: Diagnosis not present

## 2024-01-11 DIAGNOSIS — D225 Melanocytic nevi of trunk: Secondary | ICD-10-CM | POA: Diagnosis not present

## 2024-01-11 DIAGNOSIS — Z1283 Encounter for screening for malignant neoplasm of skin: Secondary | ICD-10-CM | POA: Diagnosis not present

## 2024-01-11 DIAGNOSIS — L821 Other seborrheic keratosis: Secondary | ICD-10-CM | POA: Diagnosis not present

## 2024-01-11 DIAGNOSIS — L820 Inflamed seborrheic keratosis: Secondary | ICD-10-CM | POA: Diagnosis not present

## 2024-01-11 DIAGNOSIS — X32XXXD Exposure to sunlight, subsequent encounter: Secondary | ICD-10-CM | POA: Diagnosis not present

## 2024-01-11 DIAGNOSIS — L570 Actinic keratosis: Secondary | ICD-10-CM | POA: Diagnosis not present

## 2024-02-17 DIAGNOSIS — E782 Mixed hyperlipidemia: Secondary | ICD-10-CM | POA: Diagnosis not present

## 2024-02-17 DIAGNOSIS — R682 Dry mouth, unspecified: Secondary | ICD-10-CM | POA: Diagnosis not present

## 2024-02-17 DIAGNOSIS — Z125 Encounter for screening for malignant neoplasm of prostate: Secondary | ICD-10-CM | POA: Diagnosis not present

## 2024-02-17 DIAGNOSIS — R7303 Prediabetes: Secondary | ICD-10-CM | POA: Diagnosis not present

## 2024-02-22 DIAGNOSIS — Z85828 Personal history of other malignant neoplasm of skin: Secondary | ICD-10-CM | POA: Diagnosis not present

## 2024-02-22 DIAGNOSIS — Z08 Encounter for follow-up examination after completed treatment for malignant neoplasm: Secondary | ICD-10-CM | POA: Diagnosis not present

## 2024-02-23 ENCOUNTER — Other Ambulatory Visit (HOSPITAL_COMMUNITY): Payer: Self-pay | Admitting: Nurse Practitioner

## 2024-02-23 DIAGNOSIS — R1900 Intra-abdominal and pelvic swelling, mass and lump, unspecified site: Secondary | ICD-10-CM | POA: Diagnosis not present

## 2024-02-23 DIAGNOSIS — D696 Thrombocytopenia, unspecified: Secondary | ICD-10-CM | POA: Diagnosis not present

## 2024-02-23 DIAGNOSIS — R7303 Prediabetes: Secondary | ICD-10-CM | POA: Diagnosis not present

## 2024-02-23 DIAGNOSIS — R682 Dry mouth, unspecified: Secondary | ICD-10-CM | POA: Diagnosis not present

## 2024-02-23 DIAGNOSIS — E782 Mixed hyperlipidemia: Secondary | ICD-10-CM | POA: Diagnosis not present

## 2024-02-23 DIAGNOSIS — F411 Generalized anxiety disorder: Secondary | ICD-10-CM | POA: Diagnosis not present

## 2024-02-24 ENCOUNTER — Ambulatory Visit (HOSPITAL_COMMUNITY)
Admission: RE | Admit: 2024-02-24 | Discharge: 2024-02-24 | Disposition: A | Discharge status: Home / Self Care | Source: Ambulatory Visit | Attending: Nurse Practitioner | Admitting: Nurse Practitioner

## 2024-02-24 DIAGNOSIS — K7689 Other specified diseases of liver: Secondary | ICD-10-CM | POA: Diagnosis not present

## 2024-02-24 DIAGNOSIS — R1900 Intra-abdominal and pelvic swelling, mass and lump, unspecified site: Secondary | ICD-10-CM | POA: Insufficient documentation

## 2024-02-24 DIAGNOSIS — K4090 Unilateral inguinal hernia, without obstruction or gangrene, not specified as recurrent: Secondary | ICD-10-CM | POA: Diagnosis not present

## 2024-02-24 DIAGNOSIS — K5730 Diverticulosis of large intestine without perforation or abscess without bleeding: Secondary | ICD-10-CM | POA: Diagnosis not present

## 2024-02-24 DIAGNOSIS — N200 Calculus of kidney: Secondary | ICD-10-CM | POA: Diagnosis not present

## 2024-02-29 ENCOUNTER — Other Ambulatory Visit: Payer: Self-pay | Admitting: *Deleted

## 2024-02-29 DIAGNOSIS — K4090 Unilateral inguinal hernia, without obstruction or gangrene, not specified as recurrent: Secondary | ICD-10-CM

## 2024-03-03 ENCOUNTER — Ambulatory Visit: Admitting: Surgery

## 2024-03-03 ENCOUNTER — Encounter: Payer: Self-pay | Admitting: Surgery

## 2024-03-03 VITALS — BP 131/80 | HR 65 | Temp 98.1°F | Resp 14 | Ht 67.0 in | Wt 130.0 lb

## 2024-03-03 DIAGNOSIS — K4090 Unilateral inguinal hernia, without obstruction or gangrene, not specified as recurrent: Secondary | ICD-10-CM | POA: Diagnosis not present

## 2024-03-03 NOTE — Progress Notes (Signed)
 [facility] History and Physical  Reason for Referral: Left inguinal hernia Referring Physician: Rosaline Macadam, NP  Chief Complaint   New Patient (Initial Visit)     Jonathan Bryant is a 78 y.o. male.  HPI: Patient presents for evaluation of a left inguinal hernia.  He first noticed this area about a month ago.  It for started feeling abnormal, and then he began to notice a bulge in his groin in the evenings.  When he would get up in the morning, he noted that the bulge was gone.  He is tolerating a diet without nausea and vomiting.  Since noticing the hernia, he is felt like he has had looser bowel movements and had more gas.  His past medical history significant for hyperlipidemia, and anxiety/depression.  He denies use of blood thinning medications.  He has a surgical history significant for an open right inguinal hernia repair, and previous spinal surgery.  He denies use of tobacco products, alcohol, and illicit drugs.  Past Medical History:  Diagnosis Date   Anxiety    Depression    Impaired fasting glucose     Past Surgical History:  Procedure Laterality Date   back surgery x 2     in the early 90s,. Neck surgery in 2000.   COLONOSCOPY WITH PROPOFOL  N/A 02/06/2021   Procedure: COLONOSCOPY WITH PROPOFOL ;  Surgeon: Golda Claudis PENNER, MD;  [facility]: [facility];  Service: Endoscopy;  Laterality: N/A;  820   HERNIA REPAIR     Rt inguinal surgery     late 90s    No family history on file.  Social History   Tobacco Use   Smoking status: Former   Smokeless tobacco: Never   Tobacco comments:    quit 35 yrs ago.  Vaping Use   Vaping status: Never Used  Substance Use Topics   Alcohol use: Not Currently    Comment: ocasional beer   Drug use: No    Medications: I have reviewed the patient's current medications. Allergies as of 03/03/2024   No Known Allergies      Medication List        Accurate as of March 03, 2024 11:27 AM. If you have any  questions, ask your nurse or doctor.          STOP taking these medications    fluticasone  50 MCG/ACT nasal spray Commonly known as: FLONASE  Stopped by: Riki Gehring A Otilio Groleau   ondansetron  4 MG disintegrating tablet Commonly known as: ZOFRAN -ODT Stopped by: Alakai Macbride A Jaton Eilers       TAKE these medications    ALPRAZolam  1 MG tablet Commonly known as: XANAX  Take 1 mg by mouth at bedtime.   diphenhydrAMINE 25 MG tablet Commonly known as: BENADRYL Take 25 mg by mouth every 6 (six) hours as needed for allergies.   escitalopram  20 MG tablet Commonly known as: Lexapro  Take 1 tablet (20 mg total) by mouth daily.         ROS:  Constitutional: negative for chills, fatigue, and fevers Eyes: negative for visual disturbance and pain Ears, nose, mouth, throat, and face: positive for sinus problems, negative for ear drainage and sore throat Respiratory: negative for cough, wheezing, and shortness of breath Cardiovascular: negative for chest pain and palpitations Gastrointestinal: negative for abdominal pain, nausea, reflux symptoms, and vomiting Genitourinary:negative for dysuria and frequency Integument/breast: negative for dryness and rash Hematologic/lymphatic: negative for bleeding and lymphadenopathy Musculoskeletal:positive for back pain and neck pain Neurological: positive for dizziness, negative  for tremors Endocrine: negative for temperature intolerance  Blood pressure 131/80, pulse 65, temperature 98.1 F (36.7 C), temperature source Oral, resp. rate 14, height 5' 7 (1.702 m), weight 130 lb (59 kg), SpO2 92%. Physical Exam Vitals reviewed.  Constitutional:      Appearance: Normal appearance.  HENT:     Head: Normocephalic and atraumatic.  Eyes:     Extraocular Movements: Extraocular movements intact.     Pupils: Pupils are equal, round, and reactive to light.  Cardiovascular:     Rate and Rhythm: Normal rate and regular rhythm.  Pulmonary:     Effort:  Pulmonary effort is normal.     Breath sounds: Normal breath sounds.  Abdominal:     Comments: Abdomen soft, nondistended, no percussion tenderness, nontender to palpation; no rigidity, guarding, rebound tenderness; soft and reducible left inguinal hernia  Musculoskeletal:        General: Normal range of motion.     Cervical back: Normal range of motion.  Skin:    General: Skin is warm and dry.  Neurological:     General: No focal deficit present.     Mental Status: He is alert and oriented to person, place, and time.  Psychiatric:        Mood and Affect: Mood normal.        Behavior: Behavior normal.     Results: CT abdomen and pelvis (02/24/2024): IMPRESSION: 1. No acute findings within the abdomen or pelvis. 2. Fat containing left inguinal hernia. 3. Nonobstructing right renal calculus. 4. Colonic diverticulosis without signs of acute diverticulitis. 5.  Aortic Atherosclerosis (ICD10-I70.0).  Assessment & Plan:  Jonathan Bryant is a 78 y.o. male who presents for evaluation of a left inguinal hernia.  -I discussed the pathophysiology of inguinal hernias and we discussed the recommendations for surgical repair -The risk and benefits of robotic assisted laparoscopic left inguinal hernia repair with mesh were discussed including but not limited to bleeding, infection, injury to surrounding structures, need for additional procedures, and hernia recurrence.  After careful consideration, Jonathan Bryant has decided to proceed with surgery.  -Patient tentatively scheduled for surgery on 8/15 -Information provided to the patient regarding inguinal hernias -Advised that the patient should present to the ED if they begin to have painful nonreducible bulge in his left groin, nausea, vomiting, and obstipation  All questions were answered to the satisfaction of the patient and family.  Note: Portions of this report may have been transcribed using voice recognition software. Every effort has  been made to ensure accuracy; however, inadvertent computerized transcription errors may still be present.   Dorothyann Brittle, DO [facility] [address] [place], [place] [address] [phone] (office)

## 2024-03-07 NOTE — H&P (Signed)
 [facility] History and Physical  Reason for Referral: Left inguinal hernia Referring Physician: Rosaline Macadam, NP  Chief Complaint   New Patient (Initial Visit)     Jonathan Bryant is a 78 y.o. male.  HPI: Patient presents for evaluation of a left inguinal hernia.  He first noticed this area about a month ago.  It for started feeling abnormal, and then he began to notice a bulge in his groin in the evenings.  When he would get up in the morning, he noted that the bulge was gone.  He is tolerating a diet without nausea and vomiting.  Since noticing the hernia, he is felt like he has had looser bowel movements and had more gas.  His past medical history significant for hyperlipidemia, and anxiety/depression.  He denies use of blood thinning medications.  He has a surgical history significant for an open right inguinal hernia repair, and previous spinal surgery.  He denies use of tobacco products, alcohol, and illicit drugs.  Past Medical History:  Diagnosis Date   Anxiety    Depression    Impaired fasting glucose     Past Surgical History:  Procedure Laterality Date   back surgery x 2     in the early 90s,. Neck surgery in 2000.   COLONOSCOPY WITH PROPOFOL  N/A 02/06/2021   Procedure: COLONOSCOPY WITH PROPOFOL ;  Surgeon: Golda Claudis PENNER, MD;  Location: [facility];  Service: Endoscopy;  Laterality: N/A;  820   HERNIA REPAIR     Rt inguinal surgery     late 90s    No family history on file.  Social History   Tobacco Use   Smoking status: Former   Smokeless tobacco: Never   Tobacco comments:    quit 35 yrs ago.  Vaping Use   Vaping status: Never Used  Substance Use Topics   Alcohol use: Not Currently    Comment: ocasional beer   Drug use: No    Medications: I have reviewed the patient's current medications. Allergies as of 03/03/2024   No Known Allergies      Medication List        Accurate as of March 03, 2024 11:27 AM. If you have any  questions, ask your nurse or doctor.          STOP taking these medications    fluticasone  50 MCG/ACT nasal spray Commonly known as: FLONASE  Stopped by: Riki Gehring A Otilio Groleau   ondansetron  4 MG disintegrating tablet Commonly known as: ZOFRAN -ODT Stopped by: Alakai Macbride A Jaton Eilers       TAKE these medications    ALPRAZolam  1 MG tablet Commonly known as: XANAX  Take 1 mg by mouth at bedtime.   diphenhydrAMINE 25 MG tablet Commonly known as: BENADRYL Take 25 mg by mouth every 6 (six) hours as needed for allergies.   escitalopram  20 MG tablet Commonly known as: Lexapro  Take 1 tablet (20 mg total) by mouth daily.         ROS:  Constitutional: negative for chills, fatigue, and fevers Eyes: negative for visual disturbance and pain Ears, nose, mouth, throat, and face: positive for sinus problems, negative for ear drainage and sore throat Respiratory: negative for cough, wheezing, and shortness of breath Cardiovascular: negative for chest pain and palpitations Gastrointestinal: negative for abdominal pain, nausea, reflux symptoms, and vomiting Genitourinary:negative for dysuria and frequency Integument/breast: negative for dryness and rash Hematologic/lymphatic: negative for bleeding and lymphadenopathy Musculoskeletal:positive for back pain and neck pain Neurological: positive for dizziness, negative  for tremors Endocrine: negative for temperature intolerance  Blood pressure 131/80, pulse 65, temperature 98.1 F (36.7 C), temperature source Oral, resp. rate 14, height 5' 7 (1.702 m), weight 130 lb (59 kg), SpO2 92%. Physical Exam Vitals reviewed.  Constitutional:      Appearance: Normal appearance.  HENT:     Head: Normocephalic and atraumatic.  Eyes:     Extraocular Movements: Extraocular movements intact.     Pupils: Pupils are equal, round, and reactive to light.  Cardiovascular:     Rate and Rhythm: Normal rate and regular rhythm.  Pulmonary:     Effort:  Pulmonary effort is normal.     Breath sounds: Normal breath sounds.  Abdominal:     Comments: Abdomen soft, nondistended, no percussion tenderness, nontender to palpation; no rigidity, guarding, rebound tenderness; soft and reducible left inguinal hernia  Musculoskeletal:        General: Normal range of motion.     Cervical back: Normal range of motion.  Skin:    General: Skin is warm and dry.  Neurological:     General: No focal deficit present.     Mental Status: He is alert and oriented to person, place, and time.  Psychiatric:        Mood and Affect: Mood normal.        Behavior: Behavior normal.     Results: CT abdomen and pelvis (02/24/2024): IMPRESSION: 1. No acute findings within the abdomen or pelvis. 2. Fat containing left inguinal hernia. 3. Nonobstructing right renal calculus. 4. Colonic diverticulosis without signs of acute diverticulitis. 5.  Aortic Atherosclerosis (ICD10-I70.0).  Assessment & Plan:  Jonathan Bryant is a 78 y.o. male who presents for evaluation of a left inguinal hernia.  -I discussed the pathophysiology of inguinal hernias and we discussed the recommendations for surgical repair -The risk and benefits of robotic assisted laparoscopic left inguinal hernia repair with mesh were discussed including but not limited to bleeding, infection, injury to surrounding structures, need for additional procedures, and hernia recurrence.  After careful consideration, Jonathan Bryant has decided to proceed with surgery.  -Patient tentatively scheduled for surgery on 8/15 -Information provided to the patient regarding inguinal hernias -Advised that the patient should present to the ED if they begin to have painful nonreducible bulge in his left groin, nausea, vomiting, and obstipation  All questions were answered to the satisfaction of the patient and family.  Note: Portions of this report may have been transcribed using voice recognition software. Every effort has  been made to ensure accuracy; however, inadvertent computerized transcription errors may still be present.   Dorothyann Brittle, DO [facility] [address] [address] [place], [place] [address] [phone] (office)

## 2024-03-23 ENCOUNTER — Encounter (HOSPITAL_COMMUNITY)
Admission: RE | Admit: 2024-03-23 | Discharge: 2024-03-23 | Disposition: A | Discharge status: Home / Self Care | Source: Ambulatory Visit | Attending: Surgery | Admitting: Surgery

## 2024-03-23 ENCOUNTER — Encounter (HOSPITAL_COMMUNITY): Payer: Self-pay

## 2024-03-23 ENCOUNTER — Other Ambulatory Visit: Payer: Self-pay

## 2024-03-24 NOTE — Anesthesia Preprocedure Evaluation (Addendum)
 Anesthesia Evaluation  Patient identified by MRN, date of birth, ID band Patient awake    Reviewed: Allergy & Precautions, H&P , NPO status , Patient's Chart, lab work & pertinent test results, reviewed documented beta blocker date and time   Airway Mallampati: II  TM Distance: >3 FB Neck ROM: full    Dental no notable dental hx. (+) Dental Advisory Given, Teeth Intact   Pulmonary former smoker   Pulmonary exam normal breath sounds clear to auscultation       Cardiovascular Exercise Tolerance: Good negative cardio ROS Normal cardiovascular exam Rhythm:regular Rate:Normal     Neuro/Psych  PSYCHIATRIC DISORDERS Anxiety Depression    negative neurological ROS     GI/Hepatic negative GI ROS, Neg liver ROS,,,  Endo/Other  Impaired fasting glucose  Renal/GU negative Renal ROS  negative genitourinary   Musculoskeletal   Abdominal   Peds  Hematology negative hematology ROS (+)   Anesthesia Other Findings   Reproductive/Obstetrics negative OB ROS                              Anesthesia Physical Anesthesia Plan  ASA: 2  Anesthesia Plan: General   Post-op Pain Management: Dilaudid  IV   Induction: Intravenous  PONV Risk Score and Plan: Ondansetron , Dexamethasone  and Midazolam   Airway Management Planned: Oral ETT  Additional Equipment: None  Intra-op Plan:   Post-operative Plan: Extubation in OR  Informed Consent: I have reviewed the patients History and Physical, chart, labs and discussed the procedure including the risks, benefits and alternatives for the proposed anesthesia with the patient or authorized representative who has indicated his/her understanding and acceptance.     Dental Advisory Given  Plan Discussed with: CRNA and Surgeon  Anesthesia Plan Comments:          Anesthesia Quick Evaluation

## 2024-03-25 ENCOUNTER — Ambulatory Visit (HOSPITAL_COMMUNITY)
Admission: RE | Admit: 2024-03-25 | Discharge: 2024-03-25 | Disposition: A | Discharge status: Home / Self Care | Attending: Surgery | Admitting: Surgery

## 2024-03-25 ENCOUNTER — Other Ambulatory Visit: Payer: Self-pay

## 2024-03-25 ENCOUNTER — Encounter (HOSPITAL_COMMUNITY)
Admission: RE | Disposition: A | Discharge status: Home / Self Care | Payer: Self-pay | Source: Home / Self Care | Attending: Surgery

## 2024-03-25 ENCOUNTER — Encounter (HOSPITAL_COMMUNITY): Payer: Self-pay | Admitting: Surgery

## 2024-03-25 ENCOUNTER — Ambulatory Visit (HOSPITAL_BASED_OUTPATIENT_CLINIC_OR_DEPARTMENT_OTHER): Admitting: Anesthesiology

## 2024-03-25 ENCOUNTER — Ambulatory Visit (HOSPITAL_COMMUNITY): Admitting: Anesthesiology

## 2024-03-25 DIAGNOSIS — K4090 Unilateral inguinal hernia, without obstruction or gangrene, not specified as recurrent: Secondary | ICD-10-CM | POA: Diagnosis not present

## 2024-03-25 DIAGNOSIS — I700 Atherosclerosis of aorta: Secondary | ICD-10-CM | POA: Insufficient documentation

## 2024-03-25 DIAGNOSIS — Z87891 Personal history of nicotine dependence: Secondary | ICD-10-CM | POA: Insufficient documentation

## 2024-03-25 DIAGNOSIS — R7303 Prediabetes: Secondary | ICD-10-CM

## 2024-03-25 HISTORY — PX: REPAIR, HERNIA, INGUINAL WITH MESH, ROBOT ASSISTED, LAPAROSCOPIC: SHX7398

## 2024-03-25 SURGERY — REPAIR, HERNIA, INGUINAL WITH MESH, ROBOT ASSISTED, LAPAROSCOPIC, D5
Anesthesia: General | Site: Abdomen | Laterality: Left

## 2024-03-25 MED ORDER — DEXAMETHASONE SODIUM PHOSPHATE 10 MG/ML IJ SOLN
INTRAMUSCULAR | Status: AC
  Filled 2024-03-25: qty 1

## 2024-03-25 MED ORDER — GLYCOPYRROLATE PF 0.2 MG/ML IJ SOSY
PREFILLED_SYRINGE | INTRAMUSCULAR | Status: AC
  Filled 2024-03-25: qty 1

## 2024-03-25 MED ORDER — DOCUSATE SODIUM 100 MG PO CAPS
100.0000 mg | ORAL_CAPSULE | Freq: Two times a day (BID) | ORAL | 2 refills | Status: AC
Start: 2024-03-25 — End: 2025-03-25

## 2024-03-25 MED ORDER — GLYCOPYRROLATE PF 0.2 MG/ML IJ SOSY
PREFILLED_SYRINGE | INTRAMUSCULAR | Status: DC | PRN
  Administered 2024-03-25: .2 mg via INTRAVENOUS

## 2024-03-25 MED ORDER — CHLORHEXIDINE GLUCONATE CLOTH 2 % EX PADS: 6.0000 | MEDICATED_PAD | Freq: Once | CUTANEOUS | Status: DC

## 2024-03-25 MED ORDER — BUPIVACAINE HCL (PF) 0.5 % IJ SOLN
INTRAMUSCULAR | Status: DC | PRN
  Administered 2024-03-25: 30 mL

## 2024-03-25 MED ORDER — MIDAZOLAM HCL 2 MG/2ML IJ SOLN
INTRAMUSCULAR | Status: AC
  Filled 2024-03-25: qty 2

## 2024-03-25 MED ORDER — PROPOFOL 10 MG/ML IV BOLUS
INTRAVENOUS | Status: DC | PRN
  Administered 2024-03-25: 90 mg via INTRAVENOUS

## 2024-03-25 MED ORDER — PROPOFOL 10 MG/ML IV BOLUS
INTRAVENOUS | Status: AC
  Filled 2024-03-25: qty 20

## 2024-03-25 MED ORDER — OXYCODONE HCL 5 MG PO TABS: 5.0000 mg | ORAL_TABLET | Freq: Four times a day (QID) | ORAL | 0 refills | Status: DC | PRN

## 2024-03-25 MED ORDER — OXYCODONE HCL 5 MG/5ML PO SOLN: 5.0000 mg | Freq: Once | ORAL | Status: DC | PRN

## 2024-03-25 MED ORDER — LIDOCAINE 2% (20 MG/ML) 5 ML SYRINGE
INTRAMUSCULAR | Status: AC
Start: 2024-03-25 — End: 2024-03-25
  Filled 2024-03-25: qty 5

## 2024-03-25 MED ORDER — LIDOCAINE 2% (20 MG/ML) 5 ML SYRINGE
INTRAMUSCULAR | Status: DC | PRN
  Administered 2024-03-25: 50 mg via INTRAVENOUS

## 2024-03-25 MED ORDER — OXYCODONE HCL 5 MG PO TABS: 5.0000 mg | ORAL_TABLET | Freq: Once | ORAL | Status: DC | PRN

## 2024-03-25 MED ORDER — DEXAMETHASONE SODIUM PHOSPHATE 10 MG/ML IJ SOLN
INTRAMUSCULAR | Status: DC | PRN
  Administered 2024-03-25: 10 mg via INTRAVENOUS

## 2024-03-25 MED ORDER — ONDANSETRON HCL 4 MG/2ML IJ SOLN
INTRAMUSCULAR | Status: AC
  Filled 2024-03-25: qty 2

## 2024-03-25 MED ORDER — CEFAZOLIN SODIUM-DEXTROSE 2-4 GM/100ML-% IV SOLN
2.0000 g | INTRAVENOUS | Status: AC
  Administered 2024-03-25: 2 g via INTRAVENOUS
  Filled 2024-03-25: qty 100

## 2024-03-25 MED ORDER — CHLORHEXIDINE GLUCONATE 0.12 % MT SOLN
15.0000 mL | Freq: Once | OROMUCOSAL | Status: AC
  Administered 2024-03-25: 15 mL via OROMUCOSAL

## 2024-03-25 MED ORDER — SUGAMMADEX SODIUM 200 MG/2ML IV SOLN
INTRAVENOUS | Status: AC
  Filled 2024-03-25: qty 2

## 2024-03-25 MED ORDER — ROCURONIUM BROMIDE 10 MG/ML (PF) SYRINGE
PREFILLED_SYRINGE | INTRAVENOUS | Status: AC
  Filled 2024-03-25: qty 10

## 2024-03-25 MED ORDER — ACETAMINOPHEN 160 MG/5ML PO SOLN
960.0000 mg | Freq: Once | ORAL | Status: AC
  Filled 2024-03-25: qty 30

## 2024-03-25 MED ORDER — SODIUM CHLORIDE 0.9 % IV SOLN: 12.5000 mg | INTRAVENOUS | Status: DC | PRN

## 2024-03-25 MED ORDER — HYDROMORPHONE HCL 1 MG/ML IJ SOLN: 0.2500 mg | INTRAMUSCULAR | Status: DC | PRN

## 2024-03-25 MED ORDER — ONDANSETRON HCL 4 MG/2ML IJ SOLN
INTRAMUSCULAR | Status: DC | PRN
  Administered 2024-03-25: 4 mg via INTRAVENOUS

## 2024-03-25 MED ORDER — BUPIVACAINE HCL (PF) 0.5 % IJ SOLN
INTRAMUSCULAR | Status: AC
  Filled 2024-03-25: qty 30

## 2024-03-25 MED ORDER — ACETAMINOPHEN 500 MG PO TABS
1000.0000 mg | ORAL_TABLET | Freq: Once | ORAL | Status: AC
  Administered 2024-03-25: 1000 mg via ORAL
  Filled 2024-03-25: qty 2

## 2024-03-25 MED ORDER — FENTANYL CITRATE (PF) 250 MCG/5ML IJ SOLN
INTRAMUSCULAR | Status: AC
  Filled 2024-03-25: qty 5

## 2024-03-25 MED ORDER — ROCURONIUM BROMIDE 10 MG/ML (PF) SYRINGE
PREFILLED_SYRINGE | INTRAVENOUS | Status: DC | PRN
  Administered 2024-03-25: 60 mg via INTRAVENOUS
  Administered 2024-03-25: 20 mg via INTRAVENOUS

## 2024-03-25 MED ORDER — STERILE WATER FOR IRRIGATION IR SOLN
Status: DC | PRN
  Administered 2024-03-25: 1000 mL

## 2024-03-25 MED ORDER — LACTATED RINGERS IV SOLN: INTRAVENOUS | Status: DC

## 2024-03-25 MED ORDER — FENTANYL CITRATE (PF) 250 MCG/5ML IJ SOLN
INTRAMUSCULAR | Status: DC | PRN
  Administered 2024-03-25 (×2): 50 ug via INTRAVENOUS

## 2024-03-25 MED ORDER — MIDAZOLAM HCL 2 MG/2ML IJ SOLN
INTRAMUSCULAR | Status: DC | PRN
  Administered 2024-03-25: 2 mg via INTRAVENOUS

## 2024-03-25 MED ORDER — SUGAMMADEX SODIUM 200 MG/2ML IV SOLN
INTRAVENOUS | Status: DC | PRN
  Administered 2024-03-25: 200 mg via INTRAVENOUS

## 2024-03-25 MED ORDER — ORAL CARE MOUTH RINSE: 15.0000 mL | Freq: Once | OROMUCOSAL | Status: AC

## 2024-03-25 MED ORDER — ACETAMINOPHEN 500 MG PO TABS: 1000.0000 mg | ORAL_TABLET | Freq: Four times a day (QID) | ORAL | 0 refills | Status: AC

## 2024-03-25 SURGICAL SUPPLY — 43 items
BLADE SURG 15 STRL LF DISP TIS (BLADE) ×1 IMPLANT
CHLORAPREP W/TINT 26 (MISCELLANEOUS) ×1 IMPLANT
COVER LIGHT HANDLE (MISCELLANEOUS) IMPLANT
COVER MAYO STAND XLG (MISCELLANEOUS) ×1 IMPLANT
COVER TIP SHEARS 8 DVNC (MISCELLANEOUS) ×1 IMPLANT
DEFOGGER SCOPE WARM SEASHARP (MISCELLANEOUS) ×1 IMPLANT
DERMABOND ADVANCED .7 DNX12 (GAUZE/BANDAGES/DRESSINGS) ×1 IMPLANT
DRAPE ARM DVNC X/XI (DISPOSABLE) ×3 IMPLANT
DRAPE COLUMN DVNC XI (DISPOSABLE) ×1 IMPLANT
DRIVER NDL MEGA SUTCUT DVNCXI (INSTRUMENTS) ×1 IMPLANT
DRIVER NDLE MEGA SUTCUT DVNCXI (INSTRUMENTS) ×1 IMPLANT
ELECTRODE REM PT RTRN 9FT ADLT (ELECTROSURGICAL) ×1 IMPLANT
FORCEPS BPLR R/ABLATION 8 DVNC (INSTRUMENTS) ×1 IMPLANT
GAUZE SPONGE 4X4 12PLY STRL (GAUZE/BANDAGES/DRESSINGS) ×1 IMPLANT
GLOVE BIOGEL PI IND STRL 6.5 (GLOVE) ×2 IMPLANT
GLOVE BIOGEL PI IND STRL 7.0 (GLOVE) ×4 IMPLANT
GLOVE SURG SS PI 6.5 STRL IVOR (GLOVE) ×2 IMPLANT
GOWN STRL REUS W/TWL LRG LVL3 (GOWN DISPOSABLE) ×3 IMPLANT
GRASPER SUT TROCAR 14GX15 (MISCELLANEOUS) IMPLANT
KIT PINK PAD W/HEAD ARM REST (MISCELLANEOUS) ×1 IMPLANT
KIT TURNOVER KIT A (KITS) ×1 IMPLANT
MANIFOLD NEPTUNE II (INSTRUMENTS) ×1 IMPLANT
MESH PROGRIP LAP SLF FIX 16X12 (Mesh General) ×1 IMPLANT
NDL HYPO 21X1.5 SAFETY (NEEDLE) IMPLANT
NDL INSUFFLATION 14GA 120MM (NEEDLE) ×1 IMPLANT
NEEDLE HYPO 21X1.5 SAFETY (NEEDLE) ×1 IMPLANT
NEEDLE INSUFFLATION 14GA 120MM (NEEDLE) ×1 IMPLANT
OBTURATOR OPTICALSTD 8 DVNC (TROCAR) ×1 IMPLANT
PACK LAP CHOLE LZT030E (CUSTOM PROCEDURE TRAY) ×1 IMPLANT
PENCIL HANDSWITCHING (ELECTRODE) ×1 IMPLANT
SCISSORS MNPLR CVD DVNC XI (INSTRUMENTS) ×1 IMPLANT
SEAL UNIV 5-12 XI (MISCELLANEOUS) ×3 IMPLANT
SET BASIN LINEN APH (SET/KITS/TRAYS/PACK) ×1 IMPLANT
SOL PREP POV-IOD 4OZ 10% (MISCELLANEOUS) ×1 IMPLANT
SUT MNCRL AB 4-0 PS2 18 (SUTURE) ×1 IMPLANT
SUT STRATA 3-0 SH (SUTURE) ×2 IMPLANT
SUT VIC AB 2-0 SH 27X BRD (SUTURE) IMPLANT
SUT VIC AB 3-0 SH 27X BRD (SUTURE) IMPLANT
SYR 30ML LL (SYRINGE) ×1 IMPLANT
TAPE TRANSPORE STRL 2 31045 (GAUZE/BANDAGES/DRESSINGS) ×1 IMPLANT
TRAY FOL W/BAG SLVR 16FR STRL (SET/KITS/TRAYS/PACK) ×1 IMPLANT
TUBING INSUFFLATION (TUBING) IMPLANT
WATER STERILE IRR 500ML POUR (IV SOLUTION) ×1 IMPLANT

## 2024-03-25 NOTE — Discharge Instructions (Signed)
 Ambulatory Surgery Discharge Instructions  General Anesthesia or Sedation Do not drive or operate heavy machinery for 24 hours.  Do not consume alcohol, tranquilizers, sleeping medications, or any non-prescribed medications for 24 hours. Do not make important decisions or sign any important papers in the next 24 hours. You should have someone with you tonight at home.  Activity  You are advised to go directly home from the hospital.  Restrict your activities and rest for a day.  Resume light activity tomorrow. No heavy lifting over 10 lbs or strenuous exercise.  Fluids and Diet Begin with clear liquids, bouillon, dry toast, soda crackers.  If not nauseated, you may go to a regular diet when you desire.  Greasy and spicy foods are not advised.  Medications  If you have not had a bowel movement in 24 hours, take 2 tablespoons over the counter Milk of mag.             You May resume your blood thinners tomorrow (Aspirin, coumadin, or other).  You are being discharged with prescriptions for Opioid/Narcotic Medications: There are some specific considerations for these medications that you should know. Opioid Meds have risks & benefits. Addiction to these meds is always a concern with prolonged use Take medication only as directed Do not drive while taking narcotic pain medication Do not crush tablets or capsules Do not use a different container than medication was dispensed in Lock the container of medication in a cool, dry place out of reach of children and pets. Opioid medication can cause addiction Do not share with anyone else (this is a felony) Do not store medications for future use. Dispose of them properly.     Disposal:  Find a [employer]  household drug take back site near you.  If you can't get to a drug take back site, use the recipe below as a last resort to dispose of expired, unused or unwanted drugs. Disposal  (Do not dispose chemotherapy drugs this way, talk to your  prescribing doctor instead.) Step 1: Mix drugs (do not crush) with dirt, kitty litter, or used coffee grounds and add a small amount of water  to dissolve any solid medications. Step 2: Seal drugs in plastic bag. Step 3: Place plastic bag in trash. Step 4: Take prescription container and scratch out personal information, then recycle or throw away.  Operative Site  You have a liquid bandage over your incisions, this will begin to flake off in about a week. Ok to English as a second language teacher. Keep wound clean and dry. No baths or swimming. No lifting more than 10 pounds.  Contact Information: If you have questions or concerns, please call our office, [phone], Monday- Thursday 8AM-5PM and Friday 8AM-12Noon.  If it is after hours or on the weekend, please call [facility] Main Number, [phone], and ask to speak to the surgeon on call for Dr. Evonnie at [facility].   SPECIFIC COMPLICATIONS TO WATCH FOR: Inability to urinate Fever over 101? F by mouth Nausea and vomiting lasting longer than 24 hours. Pain not relieved by medication ordered Swelling around the operative site Increased redness, warmth, hardness, around operative area Numbness, tingling, or cold fingers or toes Blood -soaked dressing, (small amounts of oozing may be normal) Increasing and progressive drainage from surgical area or exam site

## 2024-03-25 NOTE — Interval H&P Note (Signed)
 History and Physical Interval Note:  03/25/2024 7:21 AM  Jonathan Bryant  has presented today for surgery, with the diagnosis of HERNIA, INGUINAL LEFT.  The various methods of treatment have been discussed with the patient and family. After consideration of risks, benefits and other options for treatment, the patient has consented to  Procedure(s): REPAIR, HERNIA, INGUINAL WITH MESH, ROBOT ASSISTED, LAPAROSCOPIC, D5 (Left) as a surgical intervention.  The patient's history has been reviewed, patient examined, no change in status, stable for surgery.  I have reviewed the patient's chart and labs.  Questions were answered to the patient's satisfaction.     Sally-Ann Cutbirth A Damion Kant

## 2024-03-25 NOTE — Anesthesia Postprocedure Evaluation (Signed)
 Anesthesia Post Note  Patient: JABARIE POP  Procedure(s) Performed: REPAIR, HERNIA, INGUINAL WITH MESH, ROBOT ASSISTED, LAPAROSCOPIC, D5 (Left: Abdomen)  Patient location during evaluation: PACU Anesthesia Type: General Level of consciousness: awake and alert Pain management: pain level controlled Vital Signs Assessment: post-procedure vital signs reviewed and stable Respiratory status: spontaneous breathing, nonlabored ventilation, respiratory function stable and patient connected to nasal cannula oxygen Cardiovascular status: blood pressure returned to baseline and stable Postop Assessment: no apparent nausea or vomiting Anesthetic complications: no   There were no known notable events for this encounter.   Last Vitals:  Vitals:   03/25/24 1030 03/25/24 1035  BP: 116/61 116/61  Pulse: 66 68  Resp: 14 18  Temp: 36.6 C 36.6 C  SpO2: 97% 96%    Last Pain:  Vitals:   03/25/24 1035  TempSrc: Oral  PainSc: 4                  Tula Schryver L Knowledge Escandon

## 2024-03-25 NOTE — Transfer of Care (Signed)
 Immediate Anesthesia Transfer of Care Note  Patient: Jonathan Bryant  Procedure(s) Performed: REPAIR, HERNIA, INGUINAL WITH MESH, ROBOT ASSISTED, LAPAROSCOPIC, D5 (Left: Abdomen)  Patient Location: PACU  Anesthesia Type:General  Level of Consciousness: awake, alert , and oriented  Airway & Oxygen Therapy: Patient Spontanous Breathing  Post-op Assessment: Report given to RN and Post -op Vital signs reviewed and stable  Post vital signs: Reviewed and stable  Last Vitals:  Vitals Value Taken Time  BP 100/82 03/25/24 10:00  Temp    Pulse 73 03/25/24 10:03  Resp 19 03/25/24 10:03  SpO2 95 % 03/25/24 10:03  Vitals shown include unfiled device data.  Last Pain:  Vitals:   03/25/24 0646  TempSrc: Oral  PainSc: 0-No pain         Complications: No notable events documented.

## 2024-03-25 NOTE — Op Note (Signed)
 [facility] Operative Note  03/25/24  Preoperative Diagnosis: Left inguinal Hernia   Postoperative Diagnosis: Same   Procedure(s) Performed: Robotic assisted laparoscopic left inguinal hernia repair with mesh   Surgeon: Dorothyann Brittle, DO   Assistants: No qualified resident was available    Anesthesia: General endotracheal   Anesthesiologist: Landry Dunnings, MD    Specimens: None   Estimated Blood Loss: Minimal   Blood Replacement: None    Complications: None   Wound Class:Clean   Operative Indications: The patient has a left inguinal hernia that is symptomatic and they want repaired. We discussed robotic assisted laparoscopic inguinal hernia repair and risk of bleeding, infection, issues with chronic pain post operatively, use of mesh, risk of recurrence, chance of needing to repair a bilateral hernia, risk of injury to bowel or bladder, and risk of injury to cord structures for male patients.   Findings: Vas Deferens and cord structures identified and preserved Progrip Laparoscopic Mesh placed without tension Hemostasis achieved   Procedure: The patient was taken to the operating room and placed supine. General endotracheal anesthesia was induced. Intravenous antibiotics were administered per protocol.  A foley catheter was placed and a orogastric tube positioned to decompress the stomach. The abdomen was prepared and draped in the usual sterile fashion. A time-out was completed verifying correct patient, procedure, site, positioning, and implant(s) and/or special equipment prior to beginning this procedure.  An incision was marked 20 cm above the pubic tubercle, slightly above the umbilicus. Veress needle inserted at the supraumbilical site.  Saline drop test noted to be positive with gradual increase in pressure after initiation of gas insufflation.  15 mm of pressure was achieved prior to removing the Veress needle and then placing a 8 mm port via  the Optiview technique through the supraumbilical site that had been previously marked.  Inspection of the area afterwards noted no injury to the surrounding organs during insertion of the needle and the port.  2 port sites were marked 8 cm to the lateral sides of the initial port, and a 8 mm robotic port was placed on the left side, another 8 mm robotic port on the right side under direct supervision. The [employer] platform was then brought into the operative field and docked to the ports.  Examination of the abdominal cavity noted a left inguinal hernia.  A peritoneal flap was created approximately 8 cm cephalad to the defect by using scissors with electrocautery.  Dissection was carried down towards the pubic tubercle, developing the myopectineal orifice view. Laterally the flap was carried towards the ASIS.  A direct hernia sac was noted, which carefully dissected away from the adjacent tissues to be fully reduced out of hernia cavity.  Any bleeding was controlled with combination of electrocautery and manual pressure.  While dissecting the peritoneal flap, a hole was created abutting the colon which was adhesed to the peritoneum.  While no colonic injury was noted, a serosal stitch was placed in the colon overlying this area in the event there was any contact with electrocautery.  The hole in peritoneal flap was closed with 3-0 Vicryl in a figure of eight fashion.  After confirming adequate dissection and the peritoneal reflection completely down and away from the cord structures, a Progrip laparoscopic mesh was placed within the anterior abdominal wall.  After noting proper placement of the mesh with the peritoneal reflection deep to it, the previously created peritoneal flap was secured back up to the anterior abdominal wall using running  3-0 Stratafix. Any holes created in the peritoneal flap were closed with 3-0 Vicryl. All needles were then removed out of the abdominal cavity, Xi platform undocked  from the ports and removed off of operative field.  Marcaine  was instilled at the incision sites.   The abdomen was then desufflated and ports removed. All skin incisions were closed with a subcuticular stitch of Monocryl 4-0. Dermabond was applied. The testis was gently pulled down into its anatomic position in the scrotum.  Final inspection revealed acceptable hemostasis. All counts were correct at the end of the case. The patient was awakened from anesthesia and extubated without complication.  The patient went to the PACU in stable condition.   Dorothyann Brittle, DO [facility] [address] [place], [place] [address] [phone] (office)

## 2024-03-25 NOTE — Progress Notes (Signed)
 [facility]  Spoke with the patient's wife in the consultation room.  I explained that he tolerated the procedure without difficulty.  He has dissolvable stitches under the skin with overlying skin glue.  This will flake off in 10 to 14 days.  I discharged him home with a prescription for narcotic pain medication that they should take as needed for pain.  I also want him taking scheduled Tylenol.  If they take the narcotic pain medication, they should take a stool softener as well.  The patient will follow-up with me in 2 weeks.  All questions were answered to her expressed satisfaction.  Theophilus Kinds, DO [facility] [address] [place] [place], [place] [address] [phone] (office)

## 2024-03-25 NOTE — Anesthesia Procedure Notes (Signed)
 Procedure Name: Intubation Date/Time: 03/25/2024 7:42 AM  Performed by: Lynnetta Darrel Lebron Mickey., CRNAPre-anesthesia Checklist: Patient identified, Emergency Drugs available, Suction available and Patient being monitored Patient Re-evaluated:Patient Re-evaluated prior to induction Oxygen Delivery Method: Circle system utilized Preoxygenation: Pre-oxygenation with 100% oxygen Induction Type: IV induction Ventilation: Mask ventilation without difficulty Laryngoscope Size: Mac and 4 Grade View: Grade II Tube type: Oral Tube size: 7.5 mm Number of attempts: 1 Airway Equipment and Method: Stylet and Oral airway Placement Confirmation: ETT inserted through vocal cords under direct vision, positive ETCO2 and breath sounds checked- equal and bilateral Secured at: 22 cm Tube secured with: Tape Dental Injury: Teeth and Oropharynx as per pre-operative assessment

## 2024-03-28 ENCOUNTER — Encounter (HOSPITAL_COMMUNITY): Payer: Self-pay | Admitting: Surgery

## 2024-04-12 ENCOUNTER — Ambulatory Visit (INDEPENDENT_AMBULATORY_CARE_PROVIDER_SITE_OTHER): Admitting: Surgery

## 2024-04-12 ENCOUNTER — Encounter: Payer: Self-pay | Admitting: Surgery

## 2024-04-12 VITALS — BP 119/76 | HR 69 | Temp 97.7°F | Resp 16 | Ht 67.0 in | Wt 130.0 lb

## 2024-04-12 DIAGNOSIS — R682 Dry mouth, unspecified: Secondary | ICD-10-CM | POA: Diagnosis not present

## 2024-04-12 DIAGNOSIS — R194 Change in bowel habit: Secondary | ICD-10-CM | POA: Diagnosis not present

## 2024-04-12 DIAGNOSIS — Z09 Encounter for follow-up examination after completed treatment for conditions other than malignant neoplasm: Secondary | ICD-10-CM

## 2024-04-12 DIAGNOSIS — Z79899 Other long term (current) drug therapy: Secondary | ICD-10-CM | POA: Diagnosis not present

## 2024-04-12 DIAGNOSIS — F411 Generalized anxiety disorder: Secondary | ICD-10-CM | POA: Diagnosis not present

## 2024-04-12 DIAGNOSIS — K4090 Unilateral inguinal hernia, without obstruction or gangrene, not specified as recurrent: Secondary | ICD-10-CM | POA: Diagnosis not present

## 2024-04-12 DIAGNOSIS — R143 Flatulence: Secondary | ICD-10-CM | POA: Diagnosis not present

## 2024-04-12 NOTE — Progress Notes (Signed)
 [facility] Note   HPI:  78 y.o. Male presents to clinic for post-op follow-up status post robotic assisted laparoscopic left inguinal hernia repair with mesh on 8/15.  Patient has been doing well since the surgery.  He is tolerating a diet without nausea and vomiting.  He is moving his bowels, though he still has a lot of excess gas with bowel movements.  This was something that was happening prior to surgery.  He took a little bit of the narcotic pain medication but has not been needing to take anything else.  He denies fevers and chills.  Denies issues at his incision sites.  Review of Systems:  All other review of systems: otherwise negative   Vital Signs:  BP 119/76   Pulse 69   Temp 97.7 F (36.5 C) (Oral)   Resp 16   Ht 5' 7 (1.702 m)   Wt 130 lb (59 kg)   SpO2 95%   BMI 20.36 kg/m    Physical Exam:  Physical Exam Vitals reviewed.  Constitutional:      Appearance: Normal appearance.  Abdominal:     Comments: Abdomen soft, nondistended, no percussion tenderness, nontender to palpation; no rigidity, guarding, rebound tenderness; laparoscopic incision sites healing well with skin glue in place, mild improving ecchymosis  Neurological:     Mental Status: He is alert.     Laboratory studies: None  Imaging:  None  Assessment:  78 y.o. yo Male who presents for follow-up status post robotic assisted laparoscopic left inguinal hernia repair with mesh on 8/15  Plan:  - Patient overall doing well since his surgery.  Tolerating his diet, moving his bowels, and pain well-controlled - Advised that he should take it easy for an additional 2 weeks for total of 4 weeks after surgery.  When he slowly starts increasing his activity, if the activity causes pain, advised that he needs to stop the activity - Also advised that he can use antibiotic ointment prior to showering to help get off the remaining skin glue - Follow up with me as needed  All of the above  recommendations were discussed with the patient, and all of patient's questions were answered to his expressed satisfaction.  Note: Portions of this report may have been transcribed using voice recognition software. Every effort has been made to ensure accuracy; however, inadvertent computerized transcription errors may still be present.   Dorothyann Brittle, DO [facility] [address] [place], [place] [address] [phone] (office)

## 2024-06-15 DIAGNOSIS — J324 Chronic pansinusitis: Secondary | ICD-10-CM | POA: Diagnosis not present
